# Patient Record
Sex: Female | Born: 1955
Health system: Southern US, Community
[De-identification: ages and names within clinical notes are randomized; demographics above are authoritative.]

## PROBLEM LIST (undated history)

## (undated) DIAGNOSIS — F32A Depression, unspecified: Secondary | ICD-10-CM

## (undated) DIAGNOSIS — F419 Anxiety disorder, unspecified: Secondary | ICD-10-CM

## (undated) DIAGNOSIS — T4145XA Adverse effect of unspecified anesthetic, initial encounter: Secondary | ICD-10-CM

## (undated) DIAGNOSIS — F329 Major depressive disorder, single episode, unspecified: Secondary | ICD-10-CM

## (undated) DIAGNOSIS — K589 Irritable bowel syndrome without diarrhea: Secondary | ICD-10-CM

## (undated) DIAGNOSIS — K2970 Gastritis, unspecified, without bleeding: Secondary | ICD-10-CM

## (undated) DIAGNOSIS — A048 Other specified bacterial intestinal infections: Secondary | ICD-10-CM

## (undated) DIAGNOSIS — M810 Age-related osteoporosis without current pathological fracture: Secondary | ICD-10-CM

## (undated) DIAGNOSIS — T8859XA Other complications of anesthesia, initial encounter: Secondary | ICD-10-CM

## (undated) HISTORY — PX: THROAT SURGERY: SHX803

## (undated) HISTORY — DX: Other specified bacterial intestinal infections: A04.8

## (undated) HISTORY — DX: Irritable bowel syndrome, unspecified: K58.9

## (undated) HISTORY — DX: Gastritis, unspecified, without bleeding: K29.70

## (undated) HISTORY — DX: Anxiety disorder, unspecified: F41.9

## (undated) HISTORY — DX: Depression, unspecified: F32.A

## (undated) HISTORY — PX: BLADDER SURGERY: SHX569

## (undated) HISTORY — PX: ABDOMINAL HYSTERECTOMY: SHX81

## (undated) HISTORY — DX: Major depressive disorder, single episode, unspecified: F32.9

## (undated) HISTORY — DX: Age-related osteoporosis without current pathological fracture: M81.0

## (undated) HISTORY — PX: UPPER GASTROINTESTINAL ENDOSCOPY: SHX188

## (undated) HISTORY — PX: CHOLECYSTECTOMY: SHX55

---

## 2015-08-23 ENCOUNTER — Ambulatory Visit (INDEPENDENT_AMBULATORY_CARE_PROVIDER_SITE_OTHER): Payer: Self-pay | Admitting: Physician Assistant

## 2015-08-23 VITALS — BP 108/70 | HR 64 | Temp 98.4°F | Resp 18 | Ht 62.5 in | Wt 152.4 lb

## 2015-08-23 DIAGNOSIS — K625 Hemorrhage of anus and rectum: Secondary | ICD-10-CM

## 2015-08-23 DIAGNOSIS — R634 Abnormal weight loss: Secondary | ICD-10-CM

## 2015-08-23 DIAGNOSIS — R630 Anorexia: Secondary | ICD-10-CM

## 2015-08-23 DIAGNOSIS — R1013 Epigastric pain: Secondary | ICD-10-CM

## 2015-08-23 DIAGNOSIS — G8929 Other chronic pain: Secondary | ICD-10-CM

## 2015-08-23 LAB — POCT CBC
Granulocyte percent: 72.6 %G (ref 37–80)
HEMATOCRIT: 46.8 % (ref 37.7–47.9)
Hemoglobin: 14.8 g/dL (ref 12.2–16.2)
LYMPH, POC: 2.1 (ref 0.6–3.4)
MCH, POC: 27.1 pg (ref 27–31.2)
MCHC: 31.7 g/dL — AB (ref 31.8–35.4)
MCV: 85.4 fL (ref 80–97)
MID (CBC): 0.3 (ref 0–0.9)
MPV: 8.7 fL (ref 0–99.8)
PLATELET COUNT, POC: 338 10*3/uL (ref 142–424)
POC Granulocyte: 6.5 (ref 2–6.9)
POC LYMPH %: 24 % (ref 10–50)
POC MID %: 3.4 %M (ref 0–12)
RBC: 5.48 M/uL (ref 4.04–5.48)
RDW, POC: 15.9 %
WBC: 8.9 10*3/uL (ref 4.6–10.2)

## 2015-08-23 LAB — COMPREHENSIVE METABOLIC PANEL
ALBUMIN: 4.5 g/dL (ref 3.6–5.1)
ALK PHOS: 84 U/L (ref 33–130)
ALT: 12 U/L (ref 6–29)
AST: 17 U/L (ref 10–35)
BILIRUBIN TOTAL: 1.3 mg/dL — AB (ref 0.2–1.2)
BUN: 11 mg/dL (ref 7–25)
CO2: 26 mmol/L (ref 20–31)
CREATININE: 0.75 mg/dL (ref 0.50–1.05)
Calcium: 9.8 mg/dL (ref 8.6–10.4)
Chloride: 104 mmol/L (ref 98–110)
Glucose, Bld: 84 mg/dL (ref 65–99)
Potassium: 4.1 mmol/L (ref 3.5–5.3)
SODIUM: 140 mmol/L (ref 135–146)
TOTAL PROTEIN: 7.8 g/dL (ref 6.1–8.1)

## 2015-08-23 LAB — POC HEMOCCULT BLD/STL (OFFICE/1-CARD/DIAGNOSTIC): Fecal Occult Blood, POC: NEGATIVE

## 2015-08-23 LAB — LIPASE: LIPASE: 15 U/L (ref 7–60)

## 2015-08-23 MED ORDER — SUCRALFATE 1 GM/10ML PO SUSP: 1.0000 g | Freq: Three times a day (TID) | ORAL | Status: DC

## 2015-08-23 MED ORDER — OMEPRAZOLE 40 MG PO CPDR: 40.0000 mg | DELAYED_RELEASE_CAPSULE | Freq: Every day | ORAL | Status: DC

## 2015-08-23 NOTE — Patient Instructions (Signed)
Take omeprazole 40 mg once a day in the morning before eating. Drink carafate 4 times a day. You will get a phone call to make appointment with stomach doctor. Monitor your rectal bleeding and do not strain on the toilet. Increase your fiber.

## 2015-08-23 NOTE — Progress Notes (Signed)
[facility] [address] [place] [address] [phone]  Date:  08/23/2015   Name:  Casey Brewer   DOB:  1956/11/08   MRN:  [id number]  PCP:  No PCP Per Patient    Chief Complaint: Abdominal Pain; Anorexia; and Bloated   History of Present Illness:  This is a 59 y.o. female with PMH depression who is presenting with 1 year history of epigastric abdominal pain. She is presenting with family members, one of which speaks english and is translating. She arrived here from [place] 1 month ago and is planning to return in 5 months.   She gets a burning sensation with eating. When she is not eating she feels fine. States she is belching a lot. She is not eating much and feels very bloated. She has lost 8 pounds in the past 1 month d/t decreased appetite. Had endoscopy in [place] 1.5 months ago and was told she had gastritis. States a year ago when this started she had an endoscopy and had a polyp removed but never heard anything else about it. She was given medicine which she has been taking for 1.5 months but states this is not helping and stopped 4 days ago. She was taking pantoprazole 40 mg QAM, rameprazole QOD and two other medications for her stomach that are not prescribed in the [place]. She is not sure if she was ever tested for H pylori and does not recall if she has taken abx for her pain. Does not take any regular NSAIDs. She has modified her diet since all this started to avoid exacerbating her sx - has stopped eating juice, greasy food, spicy food, soda. She denies n/v/d, fever, chills, vaginal discharge, dysuria, urinay frequency, cough, CP, sob.  Small amount bright red blood on the toilet paper since yesterday. No rectal pain and no hx hemorrhoids.  She takes gabapentin for right arm pain.  Review of Systems:  Review of Systems See HPI  There are no active problems to display for this patient.   Prior to Admission medications   Medication Sig Start Date End  Date Taking? Authorizing Provider  GABAPENTIN PO Take by mouth.   Yes Historical Provider, MD    No Known Allergies  Past Surgical History  Procedure Laterality Date  . Cholecystectomy    . Abdominal hysterectomy    . Throat surgery    . Bladder surgery      Social History  Substance Use Topics  . Smoking status: Never Smoker   . Smokeless tobacco: None  . Alcohol Use: None    History reviewed. No pertinent family history.  Medication list has been reviewed and updated.  Physical Examination:  Physical Exam  Constitutional: She is oriented to person, place, and time. She appears well-developed and well-nourished. No distress.  HENT:  Head: Normocephalic and atraumatic.  Right Ear: Hearing normal.  Left Ear: Hearing normal.  Nose: Nose normal.  Mouth/Throat: Uvula is midline, oropharynx is clear and moist and mucous membranes are normal.  Eyes: Conjunctivae and lids are normal. Right eye exhibits no discharge. Left eye exhibits no discharge. No scleral icterus.  Neck: Carotid bruit is not present.  Cardiovascular: Normal rate, regular rhythm, normal heart sounds and normal pulses.   No murmur heard. Pulmonary/Chest: Effort normal and breath sounds normal. No respiratory distress. She has no wheezes. She has no rhonchi. She has no rales.  Abdominal: Soft. Normal appearance. There is tenderness.  General abdominal tenderness, worse over epigastrum  Genitourinary: Rectal exam shows internal hemorrhoid (6 o'clock). Rectal exam shows no tenderness.  Musculoskeletal: Normal range of motion.  Lymphadenopathy:       Head (right side): No submental, no submandibular and no tonsillar adenopathy present.       Head (left side): No submental, no submandibular and no tonsillar adenopathy present.    She has no cervical adenopathy.  Neurological: She is alert and oriented to person, place, and time.  Skin: Skin is warm, dry and intact. No lesion and no rash noted.  Psychiatric: She  has a normal mood and affect. Her speech is normal and behavior is normal. Thought content normal.   BP 108/70 mmHg  Pulse 64  Temp(Src) 98.4 F (36.9 C) (Oral)  Resp 18  Ht 5' 2.5" (1.588 m)  Wt 152 lb 6.4 oz (69.128 kg)  BMI 27.41 kg/m2  SpO2 98%  Results for orders placed or performed in visit on 08/23/15  POCT CBC  Result Value Ref Range   WBC 8.9 4.6 - 10.2 K/uL   Lymph, poc 2.1 0.6 - 3.4   POC LYMPH PERCENT 24.0 10 - 50 %L   MID (cbc) 0.3 0 - 0.9   POC MID % 3.4 0 - 12 %M   POC Granulocyte 6.5 2 - 6.9   Granulocyte percent 72.6 37 - 80 %G   RBC 5.48 4.04 - 5.48 M/uL   Hemoglobin 14.8 12.2 - 16.2 g/dL   HCT, POC 16.1 09.6 - 47.9 %   MCV 85.4 80 - 97 fL   MCH, POC 27.1 27 - 31.2 pg   MCHC 31.7 (A) 31.8 - 35.4 g/dL   RDW, POC 04.5 %   Platelet Count, POC 338 142 - 424 K/uL   MPV 8.7 0 - 99.8 fL  POC Hemoccult Bld/Stl (1-Cd Office Dx)  Result Value Ref Range   Card #1 Date     Fecal Occult Blood, POC Negative Negative    Assessment and Plan:  1. Abdominal pain, chronic, epigastric 2. Decreased appetite 3. Loss of weight H pylori, lipase, CMP pending. She will stop all meds prescribed in [place]. She will start prilosec QAM and carafate QID. She has been referred to GI for further evaluation. - H. pylori breath test - Lipase - Comprehensive metabolic panel - omeprazole (PRILOSEC) 40 MG capsule; Take 1 capsule (40 mg total) by mouth daily.  Dispense: 30 capsule; Refill: 3 - sucralfate (CARAFATE) 1 GM/10ML suspension; Take 10 mLs (1 g total) by mouth 4 (four) times daily -  with meals and at bedtime.  Dispense: 420 mL; Refill: 0 - Ambulatory referral to Gastroenterology  4. Rectal bleeding CBC wnl, she is not anemic. Internal hemorrhoid felt on exam, likely cause of rectal bleeding. Hemoccult negative. She has never had a colonoscopy. She has been referred to GI for eval epigastric pain -- will discuss getting colonoscopy as well. - POCT CBC - POC Hemoccult Bld/Stl  (1-Cd Office Dx)   Roswell Miners. Dyke Brackett, MHS [facility]  08/28/2015

## 2015-08-24 ENCOUNTER — Encounter: Payer: Self-pay | Admitting: Internal Medicine

## 2015-08-24 LAB — H. PYLORI BREATH TEST: H. pylori Breath Test: DETECTED — AB

## 2015-08-30 ENCOUNTER — Telehealth: Payer: Self-pay | Admitting: Physician Assistant

## 2015-08-30 DIAGNOSIS — A048 Other specified bacterial intestinal infections: Secondary | ICD-10-CM

## 2015-08-30 MED ORDER — AMOXICILLIN 500 MG PO TABS: ORAL_TABLET | ORAL | Status: AC

## 2015-08-30 MED ORDER — CLARITHROMYCIN 500 MG PO TABS: 500.0000 mg | ORAL_TABLET | Freq: Two times a day (BID) | ORAL | Status: AC

## 2015-08-30 NOTE — Telephone Encounter (Signed)
Called and LMOM to CB.  Pt has H pylori - other lab tests were negative. I am going to prescribed two antibiotics that she needs to take twice a day for 14 days.  IN ADDITION -- she needs to increase the omeprazole that I prescribed at last visit to twice a day for 14 days. At the end of the 14 days, she can go back to once a day. It is up to her whether she was wants to keep appt with GI, or cancel for now, and see if these antibiotics help to resolve her symptoms.

## 2015-08-30 NOTE — Telephone Encounter (Signed)
Granddaughter received the message from the [place]. So I just informed her of message, and to call back if she had concerns

## 2015-09-04 ENCOUNTER — Telehealth: Payer: Self-pay

## 2015-09-04 NOTE — Telephone Encounter (Signed)
I spoke to grand daughter today. Patient is being treated for Hpylori and c/o nausea with medications. I have encouraged patient to continue medications, make sure she takes the medications with food, and drinks a lot of water. I have also encouraged her to use 2 daily of the omeprazole, and to let us know if this is not helpful. To you FYI

## 2015-09-05 ENCOUNTER — Encounter: Payer: Self-pay | Admitting: Internal Medicine

## 2016-09-23 ENCOUNTER — Emergency Department (HOSPITAL_COMMUNITY): Payer: Self-pay

## 2016-09-23 ENCOUNTER — Emergency Department (HOSPITAL_COMMUNITY)
Admission: EM | Admit: 2016-09-23 | Discharge: 2016-09-24 | Disposition: A | Discharge status: Home / Self Care | Payer: Self-pay | Attending: Emergency Medicine | Admitting: Emergency Medicine

## 2016-09-23 ENCOUNTER — Encounter (HOSPITAL_COMMUNITY): Payer: Self-pay

## 2016-09-23 DIAGNOSIS — J40 Bronchitis, not specified as acute or chronic: Secondary | ICD-10-CM | POA: Insufficient documentation

## 2016-09-23 MED ORDER — ALBUTEROL SULFATE HFA 108 (90 BASE) MCG/ACT IN AERS
2.0000 | INHALATION_SPRAY | Freq: Once | RESPIRATORY_TRACT | Status: AC
  Administered 2016-09-24: 2 via RESPIRATORY_TRACT
  Filled 2016-09-23: qty 6.7

## 2016-09-23 NOTE — ED Notes (Signed)
Pt presents with 2 weeks of SOB and cough, SOB on exertion.  Pt fatigues easily. Per niece, her children are concerned with her "head and lips trembling."

## 2016-09-23 NOTE — ED Triage Notes (Signed)
Pt complaining of cough x 2 weeks. Just finished Zpack, no obvious improvement. Pt complaining of some SOB with coughing. Pt denies any chest pain.

## 2016-09-23 NOTE — ED Provider Notes (Signed)
[facility]-EMERGENCY DEPT Provider Note   CSN: [id number] Arrival date & time: 09/23/16  2049  By signing my name below, I, [name]. Elon Spanner, attest that this documentation has been prepared under the direction and in the presence of Shon Baton, MD.  Electronically Signed: [name]. Elon Spanner, ED Scribe. 09/23/16. 11:57 PM.    History   Chief Complaint Chief Complaint  Patient presents with  . Cough   HPI  HPI Comments: Casey Brewer is a 60 y.o. female without any pertinent past medical history who presents to the Emergency Department complaining of constant, unchanged productive cough x 2 weeks. Pt was initally evaluated at a near by Urgent Care for cough. X-Ray performed which revealed an "inflammed lung". She was started on antibiotics which she completed today. However, she states the medication did not help her much. Pt also reports intermittent shortness of breath and chest pain x 1-2 days. However, currently she states she is only feeling shortness of breath at the moment. No other OTC/prescribed medications attempted prior to arrival. No recent fever or chills. No history of blood clots or leg swelling.  PCP: No PCP Per Patient    Past Medical History:  Diagnosis Date  . Depression     There are no active problems to display for this patient.   Past Surgical History:  Procedure Laterality Date  . ABDOMINAL HYSTERECTOMY    . BLADDER SURGERY    . CHOLECYSTECTOMY    . THROAT SURGERY      OB History    No data available       Home Medications    Prior to Admission medications   Medication Sig Start Date End Date Taking? Authorizing Provider  albuterol (PROVENTIL HFA;VENTOLIN HFA) 108 (90 Base) MCG/ACT inhaler Inhale 2 puffs into the lungs every 4 (four) hours as needed (cough). 09/24/16   Shon Baton, MD  methylPREDNISolone (MEDROL DOSEPAK) 4 MG TBPK tablet Take as directed on packet. 09/24/16   Shon Baton, MD  omeprazole (PRILOSEC) 40 MG capsule Take  1 capsule (40 mg total) by mouth daily. Patient not taking: Reported on 09/23/2016 08/23/15   Dorna Leitz, PA-C  sucralfate (CARAFATE) 1 GM/10ML suspension Take 10 mLs (1 g total) by mouth 4 (four) times daily -  with meals and at bedtime. Patient not taking: Reported on 09/23/2016 08/23/15   Dorna Leitz, PA-C    Family History History reviewed. No pertinent family history.  Social History Social History  Substance Use Topics  . Smoking status: Never Smoker  . Smokeless tobacco: Never Used  . Alcohol use No     Allergies   Review of patient's allergies indicates no known allergies.   Review of Systems Review of Systems  Constitutional: Negative for chills and fever.  Respiratory: Positive for cough and shortness of breath.   Cardiovascular: Positive for chest pain.  Gastrointestinal: Negative for nausea.  All other systems reviewed and are negative.    Physical Exam Updated Vital Signs BP 117/79   Pulse 64   Temp 98.4 F (36.9 C) (Oral)   Resp 15   SpO2 96%   Physical Exam  Constitutional: She is oriented to person, place, and time. She appears well-developed and well-nourished. No distress.  HENT:  Head: Normocephalic and atraumatic.  Eyes: Pupils are equal, round, and reactive to light.  Cardiovascular: Normal rate, regular rhythm and normal heart sounds.   Pulmonary/Chest: Effort normal and breath sounds normal. No respiratory distress. She has no wheezes.  Abdominal: Soft. Bowel sounds are normal. There is no tenderness. There is no guarding.  Musculoskeletal: She exhibits no edema.  Neurological: She is alert and oriented to person, place, and time.  Skin: Skin is warm and dry.  Psychiatric: She has a normal mood and affect.  Nursing note and vitals reviewed.    ED Treatments / Results   DIAGNOSTIC STUDIES: Oxygen Saturation is 99% on RA, Normal by my interpretation.    COORDINATION OF CARE: 11:42 PM- Will give breathing treatment. Will order  blood work, EKG, and CXR. Discussed treatment plan with pt at bedside and pt agreed to plan.     Labs (all labs ordered are listed, but only abnormal results are displayed) Labs Reviewed  BASIC METABOLIC PANEL - Abnormal; Notable for the following:       Result Value   CO2 21 (*)    All other components within normal limits  CBC WITH DIFFERENTIAL/PLATELET  Rosezena Sensor, ED    EKG  EKG Interpretation  Date/Time:  Monday September 24 2016 00:20:30 EDT Ventricular Rate:  56 PR Interval:    QRS Duration: 79 QT Interval:  468 QTC Calculation: 452 R Axis:   34 Text Interpretation:  Sinus rhythm Low voltage, precordial leads Abnormal R-wave progression, early transition Confirmed by Wilkie Aye  MD, Billye Pickerel ([id number]) on 09/24/2016 12:38:23 AM       Radiology Dg Chest 2 View  Result Date: 09/23/2016 CLINICAL DATA:  Productive cough for 1 week.  Shortness of breath. EXAM: CHEST  2 VIEW COMPARISON:  None. FINDINGS: Cardiomediastinal silhouette is normal. No pleural effusions or focal consolidations. Trachea projects midline and there is no pneumothorax. Soft tissue planes and included osseous structures are non-suspicious. Surgical clips in the included right abdomen compatible with cholecystectomy. IMPRESSION: Normal chest. Electronically Signed   By: Awilda Metro M.D.   On: 09/23/2016 23:29    Procedures Procedures (including critical care time)  Medications Ordered in ED Medications  albuterol (PROVENTIL HFA;VENTOLIN HFA) 108 (90 Base) MCG/ACT inhaler 2 puff (2 puffs Inhalation Given 09/24/16 0021)     Initial Impression / Assessment and Plan / ED Course  I have reviewed the triage vital signs and the nursing notes.  Pertinent labs & imaging results that were available during my care of the patient were reviewed by me and considered in my medical decision making (see chart for details).  Clinical Course   Patient presents with shortness of breath. Recent diagnosis of  sounds like bronchitis. Was on anabiotic's. Not clinically getting better. She is nontoxic. Vital signs are reassuring. No respiratory distress. Chest x-ray is clear. EKG and lab work is largely reassuring. Doubt PE as patient is low risk and has had URI symptoms. Will not further investigate. Patient reports improvement with inhaler. Suspect bronchitis.  1:36 AM Patient reports improvement with albuterol. Nontoxic. Vital signs remained stable. Will discharge him with albuterol and a Medrol Dosepak.  After history, exam, and medical workup I feel the patient has been appropriately medically screened and is safe for discharge home. Pertinent diagnoses were discussed with the patient. Patient was given return precautions.   Final Clinical Impressions(s) / ED Diagnoses   Final diagnoses:  Bronchitis    New Prescriptions New Prescriptions   ALBUTEROL (PROVENTIL HFA;VENTOLIN HFA) 108 (90 BASE) MCG/ACT INHALER    Inhale 2 puffs into the lungs every 4 (four) hours as needed (cough).   METHYLPREDNISOLONE (MEDROL DOSEPAK) 4 MG TBPK TABLET    Take as directed on packet.  I personally performed the services described in this documentation, which was scribed in my presence. The recorded information has been reviewed and is accurate.    Shon Baton, MD 09/24/16 [phone]

## 2016-09-24 ENCOUNTER — Ambulatory Visit: Payer: Self-pay

## 2016-09-24 LAB — CBC WITH DIFFERENTIAL/PLATELET
BASOS ABS: 0.1 10*3/uL (ref 0.0–0.1)
BASOS PCT: 1 %
EOS ABS: 0.2 10*3/uL (ref 0.0–0.7)
Eosinophils Relative: 2 %
HEMATOCRIT: 42 % (ref 36.0–46.0)
Hemoglobin: 14.4 g/dL (ref 12.0–15.0)
Lymphocytes Relative: 35 %
Lymphs Abs: 3.4 10*3/uL (ref 0.7–4.0)
MCH: 29.2 pg (ref 26.0–34.0)
MCHC: 34.3 g/dL (ref 30.0–36.0)
MCV: 85.2 fL (ref 78.0–100.0)
MONO ABS: 0.5 10*3/uL (ref 0.1–1.0)
MONOS PCT: 5 %
NEUTROS ABS: 5.4 10*3/uL (ref 1.7–7.7)
Neutrophils Relative %: 57 %
PLATELETS: 340 10*3/uL (ref 150–400)
RBC: 4.93 MIL/uL (ref 3.87–5.11)
RDW: 13 % (ref 11.5–15.5)
WBC: 9.5 10*3/uL (ref 4.0–10.5)

## 2016-09-24 LAB — BASIC METABOLIC PANEL WITH GFR
Anion gap: 10 (ref 5–15)
BUN: 14 mg/dL (ref 6–20)
CO2: 21 mmol/L — ABNORMAL LOW (ref 22–32)
Calcium: 9.4 mg/dL (ref 8.9–10.3)
Chloride: 107 mmol/L (ref 101–111)
Creatinine, Ser: 0.79 mg/dL (ref 0.44–1.00)
GFR calc Af Amer: >60 mL/min
GFR calc non Af Amer: >60 mL/min
Glucose, Bld: 95 mg/dL (ref 65–99)
Potassium: 3.7 mmol/L (ref 3.5–5.1)
Sodium: 138 mmol/L (ref 135–145)

## 2016-09-24 LAB — I-STAT TROPONIN, ED: Troponin i, poc: 0 ng/mL (ref 0.00–0.08)

## 2016-09-24 MED ORDER — METHYLPREDNISOLONE 4 MG PO TBPK: ORAL_TABLET | ORAL | 0 refills | Status: DC

## 2016-09-24 MED ORDER — ALBUTEROL SULFATE HFA 108 (90 BASE) MCG/ACT IN AERS: 2.0000 | INHALATION_SPRAY | RESPIRATORY_TRACT | 0 refills | Status: DC | PRN

## 2016-09-24 NOTE — ED Notes (Addendum)
Patient sts she feels better after the inhaler.

## 2016-10-05 LAB — GLUCOSE, POCT (MANUAL RESULT ENTRY): POC GLUCOSE: 88 mg/dL (ref 70–99)

## 2018-04-22 ENCOUNTER — Other Ambulatory Visit: Payer: Self-pay

## 2018-04-22 ENCOUNTER — Encounter: Payer: Self-pay | Admitting: Family Medicine

## 2018-04-22 ENCOUNTER — Ambulatory Visit (INDEPENDENT_AMBULATORY_CARE_PROVIDER_SITE_OTHER): Payer: BLUE CROSS/BLUE SHIELD | Admitting: Family Medicine

## 2018-04-22 VITALS — BP 112/80 | HR 76 | Temp 98.6°F | Ht 59.84 in | Wt 152.4 lb

## 2018-04-22 DIAGNOSIS — R748 Abnormal levels of other serum enzymes: Secondary | ICD-10-CM | POA: Diagnosis not present

## 2018-04-22 DIAGNOSIS — K219 Gastro-esophageal reflux disease without esophagitis: Secondary | ICD-10-CM | POA: Diagnosis not present

## 2018-04-22 DIAGNOSIS — Z13228 Encounter for screening for other metabolic disorders: Secondary | ICD-10-CM | POA: Diagnosis not present

## 2018-04-22 NOTE — Progress Notes (Signed)
5/21/20193:26 PM  Casey Brewer 05/05/1956, 62 y.o. female [id number]  Chief Complaint  Patient presents with  . Establish Care    Was told in the past that she has a fatty liver and may have sclerosis of the liver. will like labs drawn to confirm. Just moved here [place], has had mammogram and colonoscopy. She will complete consent for medical records    HPI:   Patient is a 62 y.o. female with past medical history significant for recent long hospitalization in [place] for abnormal LFTs who presents today to establish care  Patient does not bring discharge summary She does bring some labs and diagnostic studies done during the is time She reports in December 2018 feeling ill and found to have abnormal LFTs She is s/p chole and she explains that the thought was that there might have been a stricture From what I can gather, it seems that they attempted an ERCP and were able to either partially open the stricture or place a stent.  She reports prolonged stay was from complication during endoscopic procedure where she ended with soft tissue emphysema including neck and face  Nov 30 2017 Labs WBC 7 Hgb 15.4 Hct 45 Plts 263  glc 113 Na 139 K 4.0 Cl 102 Ca 11.2 (corrected Ca 12.2) PO4 4.2 Mg 1.84  BUN 9.8 crt 0.60 TP 6.6 Alb 3.1 Globulins 3.5 ALP 237 LDH 495 AST 270 ALT 188 GGT 327 TB 1.2 Direct bili 0.6 Indirect bili 0.6 Amylase 33 Lipase 6.9  TC 190 HDL 41 LDL 129 TG 102  In Jan 21 2018, CMP had normalized except for minimally elevated PO4 of 4.8  Biopsy from EGD done on Dec 07 2017:  #1. chronic gastritis no erosive, neg h pylori #2. fragment from gastric mucosal lesion - neg h pylori, no malignancy, probable peptic ulcer  CT abd with contrast on Jan 03 2017 (question date?) Emphysema of soft tissues of thorax and abdomen Bone normal Lung bases and pleura normal Normal liver, homogenous, no dilation of intrahepatic biliary ducts Surgical absence of  gallbladder Pancreatic head and uncinate process enlarged, measuring 4.1cm. There are discrete surrounding inflammatory changes. Body and tail of pancreas are normal. Homogenous density. Normal kidneys Normal bladder Surgical absence of uterus No adnexal masses Spleen normal Stomach normal Small intestine normal Large intestine normal Aorta and iliac arteries are normal There is minimal residual free air in the mesogastic region, right paracolic gutter and left iliac fossa  She reports doing well, unclear what really happened, no way to get discharge summary and operative reports from hospital in [place] She would like to have LFTs rechecked  She reports current medications were started while she was in the hospital  She reports a normal colonoscopy 2 years ago A normal mammogram a year ago Reports last Td < 10 years ago  Fall Risk  04/22/2018  Falls in the past year? No     Depression screen PHQ 2/9 04/22/2018  Decreased Interest 0  Down, Depressed, Hopeless 0  PHQ - 2 Score 0    No Known Allergies  Prior to Admission medications   Medication Sig Start Date End Date Taking? Authorizing Provider  pantoprazole (PROTONIX) 40 MG tablet Take 40 mg by mouth daily.   Yes [provider]  Amitriptyline/diazepam/perfenazine 78m/3mg/2mg  She reports taking 1/2 tab at bedtime     Past Medical History:  Diagnosis Date  . Depression     Past Surgical History:  Procedure Laterality Date  .  ABDOMINAL HYSTERECTOMY    . BLADDER SURGERY    . CHOLECYSTECTOMY    . THROAT SURGERY      Social History   Tobacco Use  . Smoking status: Never Smoker  . Smokeless tobacco: Never Used  Substance Use Topics  . Alcohol use: No    Alcohol/week: 0.0 oz    Family History  Problem Relation Age of Onset  . Heart disease Mother     Review of Systems  Constitutional: Negative for chills and fever.  Respiratory: Negative for cough and shortness of breath.   Cardiovascular:  Negative for chest pain, palpitations and leg swelling.  Gastrointestinal: Negative for abdominal pain, blood in stool, constipation, diarrhea, heartburn, melena, nausea and vomiting.  All other systems reviewed and are negative.    OBJECTIVE:  Blood pressure 112/80, pulse 76, temperature 98.6 F (37 C), temperature source Oral, height 4' 11.84" (1.52 m), weight 152 lb 6.4 oz (69.1 kg), peak flow 98 L/min.  Physical Exam  Constitutional: She is oriented to person, place, and time. She appears well-developed and well-nourished.  HENT:  Head: Normocephalic and atraumatic.  Right Ear: Hearing, tympanic membrane, external ear and ear canal normal.  Left Ear: Hearing, tympanic membrane, external ear and ear canal normal.  Mouth/Throat: Oropharynx is clear and moist.  Eyes: Pupils are equal, round, and reactive to light. Conjunctivae and EOM are normal.  Neck: Neck supple. No thyromegaly present.  Cardiovascular: Normal rate, regular rhythm, normal heart sounds and intact distal pulses. Exam reveals no gallop and no friction rub.  No murmur heard. Pulmonary/Chest: Effort normal and breath sounds normal. She has no wheezes. She has no rales.  Abdominal: Soft. Bowel sounds are normal. She exhibits no distension and no mass. There is no tenderness.  Musculoskeletal: Normal range of motion. She exhibits no edema.  Lymphadenopathy:    She has no cervical adenopathy.  Neurological: She is alert and oriented to person, place, and time. She has normal reflexes. No cranial nerve deficit. Gait normal.  Skin: Skin is warm and dry.  Psychiatric: She has a normal mood and affect.  Nursing note and vitals reviewed.   ASSESSMENT and PLAN  1. Abnormal liver enzymes Patient completely asymptomatic and normal exam. Etiology of abnormal LFTs in Dec 2018 is unclear however they had completely normalized by Feb. CT abd suggestive of pancreatitis and EGD suggestive of peptic ulcer. Cont with PPI. Repeat  labs. Defer to GI for any further evaluation and mgt.  - CMP14+EGFR - CBC with Differential/Platelet - Lipase - Ambulatory referral to Gastroenterology  2. Gastroesophageal reflux disease, esophagitis presence not specified Controlled. Cont current regime  3. Screening for metabolic disorder - Lipid panel - TSH - Hemoglobin A1c  Discussed with patient stopping combo pill, slowly weaning off.   Other orders - pantoprazole (PROTONIX) 40 MG tablet; Take 40 mg by mouth daily.  Return in about 1 month (around 05/20/2018).    Rutherford Guys, MD [facility], [place] [address] Ph.  [phone] Fax [phone]

## 2018-04-22 NOTE — Patient Instructions (Signed)
     IF you received an x-ray today, you will receive an invoice from [facility]. Please contact [facility] at [phone] with questions or concerns regarding your invoice.   IF you received labwork today, you will receive an invoice from [facility]. Please contact [facility] at [phone] with questions or concerns regarding your invoice.   Our billing staff will not be able to assist you with questions regarding bills from these companies.  You will be contacted with the lab results as soon as they are available. The fastest way to get your results is to activate your My Chart account. Instructions are located on the last page of this paperwork. If you have not heard from us regarding the results in 2 weeks, please contact this office.     

## 2018-04-23 ENCOUNTER — Encounter: Payer: Self-pay | Admitting: Gastroenterology

## 2018-04-23 LAB — CMP14+EGFR
ALT: 18 IU/L (ref 0–32)
AST: 20 IU/L (ref 0–40)
Albumin/Globulin Ratio: 1.6 (ref 1.2–2.2)
Albumin: 4.4 g/dL (ref 3.6–4.8)
Alkaline Phosphatase: 83 IU/L (ref 39–117)
BUN/Creatinine Ratio: 15 (ref 12–28)
BUN: 12 mg/dL (ref 8–27)
Bilirubin Total: 0.6 mg/dL (ref 0.0–1.2)
CO2: 22 mmol/L (ref 20–29)
Calcium: 9.7 mg/dL (ref 8.7–10.3)
Chloride: 105 mmol/L (ref 96–106)
Creatinine, Ser: 0.79 mg/dL (ref 0.57–1.00)
GFR calc Af Amer: 93 mL/min/{1.73_m2} (ref >59)
GFR calc non Af Amer: 80 mL/min/{1.73_m2} (ref >59)
Globulin, Total: 2.8 g/dL (ref 1.5–4.5)
Glucose: 77 mg/dL (ref 65–99)
Potassium: 4.6 mmol/L (ref 3.5–5.2)
Sodium: 141 mmol/L (ref 134–144)
Total Protein: 7.2 g/dL (ref 6.0–8.5)

## 2018-04-23 LAB — CBC WITH DIFFERENTIAL/PLATELET
Basophils Absolute: 0.1 10*3/uL (ref 0.0–0.2)
Basos: 1 %
EOS (ABSOLUTE): 0.3 10*3/uL (ref 0.0–0.4)
Eos: 3 %
Hematocrit: 41.3 % (ref 34.0–46.6)
Hemoglobin: 14 g/dL (ref 11.1–15.9)
Immature Grans (Abs): 0 10*3/uL (ref 0.0–0.1)
Immature Granulocytes: 0 %
Lymphocytes Absolute: 2.9 10*3/uL (ref 0.7–3.1)
Lymphs: 27 %
MCH: 28.7 pg (ref 26.6–33.0)
MCHC: 33.9 g/dL (ref 31.5–35.7)
MCV: 85 fL (ref 79–97)
Monocytes Absolute: 0.6 10*3/uL (ref 0.1–0.9)
Monocytes: 6 %
Neutrophils Absolute: 6.8 10*3/uL (ref 1.4–7.0)
Neutrophils: 63 %
Platelets: 301 10*3/uL (ref 150–450)
RBC: 4.88 x10E6/uL (ref 3.77–5.28)
RDW: 14.4 % (ref 12.3–15.4)
WBC: 10.7 10*3/uL (ref 3.4–10.8)

## 2018-04-23 LAB — LIPID PANEL
Chol/HDL Ratio: 4.7 ratio — ABNORMAL HIGH (ref 0.0–4.4)
Cholesterol, Total: 230 mg/dL — ABNORMAL HIGH (ref 100–199)
HDL: 49 mg/dL (ref >39)
LDL Calculated: 128 mg/dL — ABNORMAL HIGH (ref 0–99)
Triglycerides: 267 mg/dL — ABNORMAL HIGH (ref 0–149)
VLDL Cholesterol Cal: 53 mg/dL — ABNORMAL HIGH (ref 5–40)

## 2018-04-23 LAB — HEMOGLOBIN A1C
Est. average glucose Bld gHb Est-mCnc: 114 mg/dL
Hgb A1c MFr Bld: 5.6 % (ref 4.8–5.6)

## 2018-04-23 LAB — TSH: TSH: 1.86 u[IU]/mL (ref 0.450–4.500)

## 2018-04-23 LAB — LIPASE: Lipase: 25 U/L (ref 14–72)

## 2018-05-21 ENCOUNTER — Other Ambulatory Visit: Payer: Self-pay

## 2018-05-21 ENCOUNTER — Encounter: Payer: Self-pay | Admitting: Family Medicine

## 2018-05-21 ENCOUNTER — Ambulatory Visit: Payer: BLUE CROSS/BLUE SHIELD | Admitting: Family Medicine

## 2018-05-21 VITALS — BP 108/68 | HR 79 | Temp 98.8°F | Resp 16 | Ht 60.95 in | Wt 151.6 lb

## 2018-05-21 DIAGNOSIS — R748 Abnormal levels of other serum enzymes: Secondary | ICD-10-CM | POA: Diagnosis not present

## 2018-05-21 DIAGNOSIS — J0110 Acute frontal sinusitis, unspecified: Secondary | ICD-10-CM

## 2018-05-21 DIAGNOSIS — J302 Other seasonal allergic rhinitis: Secondary | ICD-10-CM | POA: Diagnosis not present

## 2018-05-21 MED ORDER — AMOXICILLIN-POT CLAVULANATE 875-125 MG PO TABS: 1.0000 | ORAL_TABLET | Freq: Two times a day (BID) | ORAL | 0 refills | Status: DC

## 2018-05-21 MED ORDER — FLUTICASONE PROPIONATE 50 MCG/ACT NA SUSP: 1.0000 | Freq: Two times a day (BID) | NASAL | 6 refills | Status: DC

## 2018-05-21 NOTE — Patient Instructions (Addendum)
  1. comprar generico de zyrtec o allegra, tomarse una pastilla diaria   IF you received an x-ray today, you will receive an invoice from [facility]. Please contact [facility] at [phone] with questions or concerns regarding your invoice.   IF you received labwork today, you will receive an invoice from [facility]. Please contact [facility] at [phone] with questions or concerns regarding your invoice.   Our billing staff will not be able to assist you with questions regarding bills from these companies.  You will be contacted with the lab results as soon as they are available. The fastest way to get your results is to activate your My Chart account. Instructions are located on the last page of this paperwork. If you have not heard from us regarding the results in 2 weeks, please contact this office.

## 2018-05-21 NOTE — Progress Notes (Signed)
6/19/201911:59 AM  Casey Brewer 12-08-1955, 62 y.o. female [id number]  Chief Complaint  Patient presents with  . Cough    with nasal drainage and sore throat x 6 days   . Headache    HPI:   Patient is a 62 y.o. female with past medical history significant for abnormal LFTs  who presents today for 6 days of sneezing, itchy ears and throat, nasal congestion with drainage, and frontal headache pressure Has occasional cough, chills and malaise Has not tried anything for this Denies fever, SOB, nausea, vomiting  Fall Risk  05/21/2018 04/22/2018  Falls in the past year? No No     [facility] 05/21/2018 04/22/2018  Decreased Interest 0 0  Down, Depressed, Hopeless 0 0  PHQ - 2 Score 0 0    No Known Allergies  Prior to Admission medications   Medication Sig Start Date End Date Taking? Authorizing Provider  pantoprazole (PROTONIX) 40 MG tablet Take 40 mg by mouth daily.   Yes [provider]    Past Medical History:  Diagnosis Date  . Depression     Past Surgical History:  Procedure Laterality Date  . ABDOMINAL HYSTERECTOMY    . BLADDER SURGERY    . CHOLECYSTECTOMY    . THROAT SURGERY      Social History   Tobacco Use  . Smoking status: Never Smoker  . Smokeless tobacco: Never Used  Substance Use Topics  . Alcohol use: No    Alcohol/week: 0.0 oz    Family History  Problem Relation Age of Onset  . Heart disease Mother     ROS Per hpi  OBJECTIVE:  Blood pressure 108/68, pulse 79, temperature 98.8 F (37.1 C), temperature source Oral, resp. rate 16, height 5' 0.95" (1.548 m), weight 151 lb 9.6 oz (68.8 kg), SpO2 95 %.  Physical Exam  Constitutional: She is oriented to person, place, and time. She appears well-developed and well-nourished.  HENT:  Head: Normocephalic and atraumatic.  Right Ear: Hearing, tympanic membrane, external ear and ear canal normal.  Left Ear: Hearing, tympanic membrane, external ear and ear canal  normal.  Nose: Mucosal edema and rhinorrhea present. Right sinus exhibits frontal sinus tenderness.  Mouth/Throat: Oropharynx is clear and moist and mucous membranes are normal.  Eyes: Pupils are equal, round, and reactive to light. Conjunctivae and EOM are normal.  Neck: Neck supple.  Cardiovascular: Normal rate, regular rhythm and normal heart sounds. Exam reveals no gallop and no friction rub.  No murmur heard. Pulmonary/Chest: Effort normal and breath sounds normal. She has no wheezes. She has no rales.  Lymphadenopathy:    She has no cervical adenopathy.  Neurological: She is alert and oriented to person, place, and time.  Skin: Skin is warm and dry.  Psychiatric: She has a normal mood and affect.  Nursing note and vitals reviewed.    ASSESSMENT and PLAN  1. Seasonal allergies 2. Acute non-recurrent frontal sinusitis Discussed supportive measures, new meds r/se/b and RTC precautions. Patient educational handout given.  3. Abnormal liver enzymes Normal LFTs a month ago. Has appt with GI in July, advised she keep this appt given complicated unclear hospitalization. Discussed that most likely nothing else would be needed.   Other orders - fluticasone (FLONASE) 50 MCG/ACT nasal spray; Place 1 spray into both nostrils 2 (two) times daily. - amoxicillin-clavulanate (AUGMENTIN) 875-125 MG tablet; Take 1 tablet by mouth 2 (two) times daily.  Return if symptoms worsen or fail to improve.  Doriann Zuch M Santiago, MD [facility] [address] [place], [place] [address] Ph.  [phone] Fax [phone]   

## 2018-06-10 ENCOUNTER — Other Ambulatory Visit (INDEPENDENT_AMBULATORY_CARE_PROVIDER_SITE_OTHER): Payer: BLUE CROSS/BLUE SHIELD

## 2018-06-10 ENCOUNTER — Ambulatory Visit: Payer: BLUE CROSS/BLUE SHIELD | Admitting: Gastroenterology

## 2018-06-10 ENCOUNTER — Encounter: Payer: Self-pay | Admitting: Gastroenterology

## 2018-06-10 VITALS — BP 102/64 | HR 68 | Ht 63.0 in | Wt 154.0 lb

## 2018-06-10 DIAGNOSIS — K8020 Calculus of gallbladder without cholecystitis without obstruction: Secondary | ICD-10-CM

## 2018-06-10 LAB — CBC WITH DIFFERENTIAL/PLATELET
Basophils Absolute: 0.1 10*3/uL (ref 0.0–0.1)
Basophils Relative: 1.1 % (ref 0.0–3.0)
EOS PCT: 4.6 % (ref 0.0–5.0)
Eosinophils Absolute: 0.3 10*3/uL (ref 0.0–0.7)
HCT: 40.4 % (ref 36.0–46.0)
Hemoglobin: 13.7 g/dL (ref 12.0–15.0)
Lymphocytes Relative: 35.2 % (ref 12.0–46.0)
Lymphs Abs: 2.4 10*3/uL (ref 0.7–4.0)
MCHC: 33.8 g/dL (ref 30.0–36.0)
MCV: 85.8 fl (ref 78.0–100.0)
MONOS PCT: 7 % (ref 3.0–12.0)
Monocytes Absolute: 0.5 10*3/uL (ref 0.1–1.0)
NEUTROS PCT: 52.1 % (ref 43.0–77.0)
Neutro Abs: 3.5 10*3/uL (ref 1.4–7.7)
Platelets: 281 10*3/uL (ref 150.0–400.0)
RBC: 4.71 Mil/uL (ref 3.87–5.11)
RDW: 13.8 % (ref 11.5–15.5)
WBC: 6.7 10*3/uL (ref 4.0–10.5)

## 2018-06-10 LAB — COMPREHENSIVE METABOLIC PANEL
ALT: 17 U/L (ref 0–35)
AST: 25 U/L (ref 0–37)
Albumin: 3.8 g/dL (ref 3.5–5.2)
Alkaline Phosphatase: 67 U/L (ref 39–117)
BILIRUBIN TOTAL: 0.8 mg/dL (ref 0.2–1.2)
BUN: 17 mg/dL (ref 6–23)
CO2: 29 meq/L (ref 19–32)
CREATININE: 0.74 mg/dL (ref 0.40–1.20)
Calcium: 9.3 mg/dL (ref 8.4–10.5)
Chloride: 104 mEq/L (ref 96–112)
GFR: 84.33 mL/min (ref >60.00)
GLUCOSE: 93 mg/dL (ref 70–99)
Potassium: 3.7 mEq/L (ref 3.5–5.1)
Sodium: 139 mEq/L (ref 135–145)
TOTAL PROTEIN: 7.3 g/dL (ref 6.0–8.3)

## 2018-06-10 NOTE — Progress Notes (Signed)
HPI: This is a very pleasant 62 year old woman who was referred to me by Myles LippsSantiago, Irma M, MD  to evaluate history of elevated liver tests.    Chief complaint is elevated liver tests previously  Adult nurserofessional translator in the room.  Also daughter in the room.  She brought records from [place].  Sounds like ERCP, complicated by subcutaneous air throughout her face, neck, body and legs.  NOt sur if ERCP was successful.  Seems like it was for a bile duct stone.  She was admitted for what sounds like a 10-day stay in a hospital in [place].  Records reviewed that were cut and pasted from PCP note 2 months ago, see those below.  After leaving the hospital in [place] the subcutaneous air slowly improved over 1 or 2 months.  She never had recurrent abdominal pains.  Her weight has been overall stable.  GB was removed 12 years ago.    Old Data Reviewed: ------------------------------------------------------------------------------------------------------------------------------ Below is cut-pasted from 04/2018 PCP office visit where he transcribed [place] records:  Nov 30 2017 Labs WBC 7 Hgb 15.4 Hct 45 Plts 263  glc 113 Na 139 K 4.0 Cl 102 Ca 11.2 (corrected Ca 12.2) PO4 4.2 Mg 1.84  BUN 9.8 crt 0.60 TP 6.6 Alb 3.1 Globulins 3.5 ALP 237 LDH 495 AST 270 ALT 188 GGT 327 TB 1.2 Direct bili 0.6 Indirect bili 0.6 Amylase 33 Lipase 6.9  TC 190 HDL 41 LDL 129 TG 102  In Jan 21 2018, CMP had normalized except for minimally elevated PO4 of 4.8  Biopsy from EGD done on Dec 07 2017:  #1. chronic gastritis no erosive, neg h pylori #2. fragment from gastric mucosal lesion - neg h pylori, no malignancy, probable peptic ulcer  CT abd with contrast on Jan 03 2017 (question date?) Emphysema of soft tissues of thorax and abdomen Bone normal Lung bases and pleura normal Normal liver, homogenous, no dilation of intrahepatic biliary ducts Surgical absence of  gallbladder Pancreatic head and uncinate process enlarged, measuring 4.1cm. There are discrete surrounding inflammatory changes. Body and tail of pancreas are normal. Homogenous density. Normal kidneys Normal bladder Surgical absence of uterus No adnexal masses Spleen normal Stomach normal Small intestine normal Large intestine normal Aorta and iliac arteries are normal There is minimal residual free air in the mesogastic region, right paracolic gutter and left iliac fossa  --------------------------------------------------------------------------------------------------------------------------------------------------------  Labs [place] 04/2018: CBC, cmet, lipase, HbA1c were all normal.     Review of systems: Pertinent positive and negative review of systems were noted in the above HPI section. All other review negative.   Past Medical History:  Diagnosis Date  . Depression     Past Surgical History:  Procedure Laterality Date  . ABDOMINAL HYSTERECTOMY    . BLADDER SURGERY    . CHOLECYSTECTOMY    . THROAT SURGERY      No current outpatient medications on file.   No current facility-administered medications for this visit.     Allergies as of 06/10/2018  . (No Known Allergies)    Family History  Problem Relation Age of Onset  . Heart disease Mother     Social History   Socioeconomic History  . Marital status: Married    Spouse name: Not on file  . Number of children: Not on file  . Years of education: Not on file  . Highest education level: Not on file  Occupational History  . Not on file  Social Needs  . Financial resource strain:  Not on file  . Food insecurity:    Worry: Not on file    Inability: Not on file  . Transportation needs:    Medical: Not on file    Non-medical: Not on file  Tobacco Use  . Smoking status: Never Smoker  . Smokeless tobacco: Never Used  Substance and Sexual Activity  . Alcohol use: No    Alcohol/week: 0.0 oz  . Drug  use: Not Currently  . Sexual activity: Not on file  Lifestyle  . Physical activity:    Days per week: Not on file    Minutes per session: Not on file  . Stress: Not on file  Relationships  . Social connections:    Talks on phone: Not on file    Gets together: Not on file    Attends religious service: Not on file    Active member of club or organization: Not on file    Attends meetings of clubs or organizations: Not on file    Relationship status: Not on file  . Intimate partner violence:    Fear of current or ex partner: Not on file    Emotionally abused: Not on file    Physically abused: Not on file    Forced sexual activity: Not on file  Other Topics Concern  . Not on file  Social History Narrative  . Not on file     Physical Exam: BP 102/64   Pulse 68   Ht 5\' 3"  (1.6 m)   Wt 154 lb (69.9 kg)   BMI 27.28 kg/m  Constitutional: generally well-appearing Psychiatric: alert and oriented x3 Eyes: extraocular movements intact Mouth: oral pharynx moist, no lesions Neck: supple no lymphadenopathy Cardiovascular: heart regular rate and rhythm Lungs: clear to auscultation bilaterally Abdomen: soft, nontender, nondistended, no obvious ascites, no peritoneal signs, normal bowel sounds Extremities: no lower extremity edema bilaterally Skin: no lesions on visible extremities   Assessment and plan: 62 y.o. female with acute illness while in [place] that sounds like retained bile duct stone and complications following an ERCP  Even with a professional translator the history was still difficult and small but important nuances of the history may have been missed.  It does sound like she had gallstone related illness and probably complications following an ERCP that led to a 10-day stay while in [place].  Liver tests done 1 or 2 months ago here were perfectly normal, her weight has been stable, she has had no recurrent abdominal pains and so I suspect that she does not still have a  retained stone however I am not certain of that by any means.  They were told the procedure was not successful and I think that this is in regards to the ERCP.  I recommended MRI with MRCP to answer the question of whether she still has retained stones in her bile duct.  She will also get repeat set of liver testing and CBC today.  I did briefly explained to her that if she does still have retained stones in her bile duct then ERCP is likely to be recommended.  Since she had what sounds like post years to be complications while in [place] she is probably at higher risk for complications with repeat ERCP.   Please see the "Patient Instructions" section for addition details about the plan.   Rob Bunting, MD [facility] 06/10/2018, 10:47 AM  Cc: Myles Lipps, MD

## 2018-06-10 NOTE — Patient Instructions (Addendum)
You will have labs checked today in the basement lab.  Please head down after you check out with the front desk  (cbc, cmet). MRI with MRCP; does she still have bile duct stones?  [facility]. Your appointment time is 4pm. Please arrive 15 minutes prior to your appointment time for registration purposes. Please make certain not to have anything to eat or drink 6 hours prior to your test. In addition, if you have any metal in your body, have a pacemaker or defibrillator, please be sure to let your ordering physician know. This test typically takes 45 minutes to 1 hour to complete. Should you need to reschedule, please call [phone] to do so.  Your provider has requested that you go to the basement level for lab work before leaving today. Press "B" on the elevator. The lab is located at the first door on the left as you exit the elevator.

## 2018-06-16 ENCOUNTER — Other Ambulatory Visit: Payer: Self-pay | Admitting: Gastroenterology

## 2018-06-16 ENCOUNTER — Ambulatory Visit (HOSPITAL_COMMUNITY)
Admission: RE | Admit: 2018-06-16 | Discharge: 2018-06-16 | Disposition: A | Discharge status: Home / Self Care | Payer: BLUE CROSS/BLUE SHIELD | Source: Ambulatory Visit | Attending: Gastroenterology | Admitting: Gastroenterology

## 2018-06-16 DIAGNOSIS — J9811 Atelectasis: Secondary | ICD-10-CM | POA: Diagnosis not present

## 2018-06-16 DIAGNOSIS — K8020 Calculus of gallbladder without cholecystitis without obstruction: Secondary | ICD-10-CM | POA: Diagnosis not present

## 2018-06-16 DIAGNOSIS — I517 Cardiomegaly: Secondary | ICD-10-CM | POA: Diagnosis not present

## 2018-06-16 DIAGNOSIS — K8050 Calculus of bile duct without cholangitis or cholecystitis without obstruction: Secondary | ICD-10-CM | POA: Diagnosis not present

## 2018-06-16 DIAGNOSIS — R935 Abnormal findings on diagnostic imaging of other abdominal regions, including retroperitoneum: Secondary | ICD-10-CM | POA: Diagnosis not present

## 2018-06-16 MED ORDER — GADOBENATE DIMEGLUMINE 529 MG/ML IV SOLN
15.0000 mL | Freq: Once | INTRAVENOUS | Status: AC | PRN
  Administered 2018-06-16: 14 mL via INTRAVENOUS

## 2018-06-26 ENCOUNTER — Telehealth: Payer: Self-pay | Admitting: Gastroenterology

## 2018-06-26 NOTE — Telephone Encounter (Signed)
Advised patient we would get back to her sometime next week after we spoken to physician. They do not speak english and I will be glad to call her back.

## 2018-06-27 NOTE — Progress Notes (Signed)
Casey Brewer  MRN: [id number] DOB: November 02, 1956  Subjective:  Casey Brewer is a 62 y.o. female seen in office today for a chief complaint of "dimples" on labia and anus x 2 days. Lesions on vagina are irritating and itch. Area on anus burns and is painful. Has noticed bright red blood when wiping in the past, not recently. Has had recurrent outbreaks of each for past two years.  15 x in past 2 years. Resolve after about 2 weeks. Happens when she is nervous. Notes she has a rip in the anus after child birth years ago and did not have it repaired. Has experienced this discomfort each time she has to strain to have a BM. Denies blisters, tingling, constipation, nausea, vomiting,and  rectal bleeding. Has tried preparation H and nikzon for hemorrhoids with no full relief. She is sexually active with monogamous husband. No PMH of STD. PMH of hemorrhoids and oral cold sores. Last colonoscopy was 2017. PSH of hysterectomy.   [employer] Spanish Interpreter Casey HesselbachMaria [phone]#[id number] used.   Review of Systems  Constitutional: Negative for chills, diaphoresis and fever.  Genitourinary: Negative for dyspareunia, dysuria, frequency, pelvic pain, vaginal bleeding and vaginal discharge.    There are no active problems to display for this patient.   No current outpatient medications on file prior to visit.   No current facility-administered medications on file prior to visit.     No Known Allergies    Social History   Socioeconomic History  . Marital status: Married    Spouse name: Not on file  . Number of children: Not on file  . Years of education: Not on file  . Highest education level: Not on file  Occupational History  . Not on file  Social Needs  . Financial resource strain: Not on file  . Food insecurity:    Worry: Not on file    Inability: Not on file  . Transportation needs:    Medical: Not on file    Non-medical: Not on file  Tobacco Use  . Smoking status: Never Smoker  .  Smokeless tobacco: Never Used  Substance and Sexual Activity  . Alcohol use: No    Alcohol/week: 0.0 oz  . Drug use: Not Currently  . Sexual activity: Not on file  Lifestyle  . Physical activity:    Days per week: Not on file    Minutes per session: Not on file  . Stress: Not on file  Relationships  . Social connections:    Talks on phone: Not on file    Gets together: Not on file    Attends religious service: Not on file    Active member of club or organization: Not on file    Attends meetings of clubs or organizations: Not on file    Relationship status: Not on file  . Intimate partner violence:    Fear of current or ex partner: Not on file    Emotionally abused: Not on file    Physically abused: Not on file    Forced sexual activity: Not on file  Other Topics Concern  . Not on file  Social History Narrative  . Not on file    Objective:  BP 122/79 (BP Location: Left Arm, Patient Position: Sitting, Cuff Size: Normal)   Pulse 66   Temp 98.6 F (37 C) (Oral)   Ht 5' 0.79" (1.544 m)   Wt 153 lb (69.4 kg)   SpO2 96%   BMI 29.11 kg/m  Physical Exam  Constitutional: She is oriented to person, place, and time. She appears well-developed and well-nourished. No distress.  HENT:  Head: Normocephalic and atraumatic.  Eyes: Conjunctivae are normal.  Neck: Normal range of motion.  Pulmonary/Chest: Effort normal.  Genitourinary: Rectal exam shows fissure (one small fissure at 12 o clock and one larger fissure at 6 o clock,) and tenderness (with palpation of fissures). Rectal exam shows no external hemorrhoid and no internal hemorrhoid.     Neurological: She is alert and oriented to person, place, and time.  Skin: Skin is warm and dry.  Psychiatric: She has a normal mood and affect.  Vitals reviewed.  CMA chaperone present for GU and rectal exam. Assessment and Plan :  1. Condyloma acuminatum in female Hx and PE findings consistent with condyloma. Rec topical therapy.  Appearance is not suspicious for HSV but pt is concerned about this and requests all STD testing and referral to gynecologist at this time. Labs pending. Referral placed.  - imiquimod (ALDARA) 5 % cream; Apply topically 3 (three) times a week. Apply a thin layer 3 times per week (on alternate days) prior to bedtime; leave on skin for 6 to 10 hours, then remove with mild soap and water. Continue until there is total clearance of the genital/perianal warts or for a maximum duration of therapy of 16 weeks.  Dispense: 12 each; Refill: 1  2. Anal fissure Hx and PE findings consistent with anal fissure. Rec topical therapy and avoid straining during BM.  - diltiazem 2 % GEL; Apply a pea-sized amount to the affected area 3 times daily.  Dispense: 30 g; Refill: 0 - Lidocaine, Anorectal, 5 % GEL; Apply a pea-sized amount to the affected area 3 times daily.  Dispense: 30 g; Refill: 0  3. Vaginal lesion Labs pending.  - GC/Chlamydia Probe Amp - Hepatitis panel, acute - HIV antibody - RPR - Trichomonas vaginalis, RNA - HSV(herpes simplex vrs) 1+2 ab-IgG - Ambulatory referral to Gynecology   Benjiman Core PA-C  Primary Care at [facility] 06/28/2018 12:04 PM

## 2018-06-28 ENCOUNTER — Encounter: Payer: Self-pay | Admitting: Physician Assistant

## 2018-06-28 ENCOUNTER — Ambulatory Visit: Payer: BLUE CROSS/BLUE SHIELD | Admitting: Physician Assistant

## 2018-06-28 ENCOUNTER — Other Ambulatory Visit: Payer: Self-pay

## 2018-06-28 VITALS — BP 122/79 | HR 66 | Temp 98.6°F | Ht 60.79 in | Wt 153.0 lb

## 2018-06-28 DIAGNOSIS — K602 Anal fissure, unspecified: Secondary | ICD-10-CM

## 2018-06-28 DIAGNOSIS — N898 Other specified noninflammatory disorders of vagina: Secondary | ICD-10-CM | POA: Diagnosis not present

## 2018-06-28 DIAGNOSIS — A630 Anogenital (venereal) warts: Secondary | ICD-10-CM

## 2018-06-28 MED ORDER — DILTIAZEM GEL 2 %: CUTANEOUS | 0 refills | Status: DC

## 2018-06-28 MED ORDER — LIDOCAINE (ANORECTAL) 5 % EX GEL: CUTANEOUS | 0 refills | Status: DC

## 2018-06-28 MED ORDER — IMIQUIMOD 5 % EX CREA: TOPICAL_CREAM | CUTANEOUS | 1 refills | Status: DC

## 2018-06-28 NOTE — Patient Instructions (Addendum)
Para problemas anales, aplique lidocana y crema diltiazem en el rea afectada 3 veces al da. Evite el estreimiento.  Para la vagina, [place] crema tpica 3 veces a la semana. Siga las instrucciones en el tubo. He colocado una referencia a ginecologa y que deben ponerse en contacto con usted dentro de 2 semanas.  Gracias por dejarme participar en su salud y Health visitor.  For anal issues, apply lidocaine and diltiazem cream to affected area 3 times a day. Avoid straining while using the bathroom.  For vagina, use topical cream 3 times a week. Follow directions on tube. I have placed a referral to gynecology and they should contact you within 2 weeks.  Thank you for letting me participate in your health and well being.  Fisura anal en los adultos (Anal Fissure, Adult) Una fisura anal es un pequeo desgarro o un corte en la piel que rodea el orificio del conducto anal (ano).El sangrado proveniente del desgarro o el corte suele detenerse solo despus de algunos minutos. Es probable que note Psychologist, educational materia fecal, hasta tanto el desgarro o el corte cicatricen. CUIDADOS EN EL HOGAR Comida y bebida  No consuma bananas ni productos lcteos, ya que estos alimentos pueden causar estreimiento.  Beba suficiente lquido para mantener el pis (orina) claro o de color amarillo plido.  Consuma gran cantidad de frutas, cereales integrales y verduras. Instrucciones generales  Mantenga la zona anal tan limpia y seca como sea posible.  Dese un bao de agua tibia (bao de asiento) como se lo haya indicado el mdico. No use jabn para limpiar la zona afectada.  Tome los medicamentos de venta libre y los recetados solamente como se lo haya indicado el mdico.  Use cremas o ungentos solamente como se lo haya indicado el mdico.  Concurra a todas las visitas de control como se lo haya indicado el mdico. Esto es importante. SOLICITE AYUDA SI:  Aumenta el sangrado.  Tiene fiebre.  Tiene heces  acuosas (diarrea) mezcladas con sangre.  Siente dolor.  El problema [place], en lugar de mejorar. Esta informacin no tiene Theme park manager el consejo del mdico. Asegrese de hacerle al mdico cualquier pregunta que tenga. Document Released: 07/18/2011 Document Revised: 08/10/2015 Document Reviewed: 02/14/2015 Elsevier Interactive Patient Education  2018 [employer].  [place] Docker genitales (Genital Warts) Las verrugas genitales son pequeos crecimientos en la zona genital o anal. Son causadas por un tipo de germen (virus del papiloma humano, VPH) que se transmite de Physiological scientist persona a otra durante las [employer]. Se puede transmitir durante el sexo vaginal, anal y oral. Si no se las trata, las verrugas genitales pueden causar otros problemas. CUIDADOS EN EL HOGAR Medicamentos  Aplquese los medicamentos de venta libre y los recetados solamente como se lo haya indicado el mdico.  No use los medicamentos indicados para el tratamiento de las verrugas de las manos.  Pregntele al mdico si puede usar cremas para Associate Professor. Instrucciones generales  No toque ni rasque las verrugas.  No mantenga relaciones sexuales Librarian, academic.  Informe a sus excompaeros sexuales y a los [employer] la afeccin que padece. Tal vez deban recibir tratamiento.  Concurra a todas las visitas de control como se lo haya indicado el mdico. Esto es importante.  Despus del tratamiento, use preservativos durante las [employer]. Otras indicaciones para las mujeres  Es posible que las mujeres que tienen verrugas genitales deban controlarse ms a menudo para Landscape architect presencia de cncer de cuello de tero.  Si queda embarazada, infrmele al mdico que Circuit Citytuvo verrugas genitales, ya que el germen puede transmitirse al feto. SOLICITE AYUDA SI:  Siente dolor en la zona de la piel tratada o esta est enrojecida e hinchada.  Tiene fiebre.  Siente un Advertising copywritermalestar  generalizado.  Palpa bultos en la zona genital o en la anal, o alrededor de estas.  Tiene sangrado en la zona genital o la anal.  Siente dolor durante las relaciones sexuales. Esta informacin no tiene Theme park managercomo fin reemplazar el consejo del mdico. Asegrese de hacerle al mdico cualquier pregunta que tenga. Document Released: 07/18/2011 Document Revised: 08/10/2015 Document Reviewed: 02/14/2015 Elsevier Interactive Patient Education  2018 [employer]     IF you received an x-ray today, you will receive an invoice from [facility]. Please contact [facility] at [phone] with questions or concerns regarding your invoice.   IF you received labwork today, you will receive an invoice from [facility]. Please contact [facility] at [phone] with questions or concerns regarding your invoice.   Our billing staff will not be able to assist you with questions regarding bills from these companies.  You will be contacted with the lab results as soon as they are available. The fastest way to get your results is to activate your My Chart account. Instructions are located on the last page of this paperwork. If you have not heard from us regarding the results in 2 weeks, please contact this office.

## 2018-06-29 LAB — HSV(HERPES SIMPLEX VRS) I + II AB-IGG
HSV 1 Glycoprotein G Ab, IgG: 23.1 index — ABNORMAL HIGH (ref 0.00–0.90)
HSV 2 IgG, Type Spec: 13.9 index — ABNORMAL HIGH (ref 0.00–0.90)

## 2018-06-29 LAB — HEPATITIS PANEL, ACUTE
HEP B C IGM: NEGATIVE
HEP B S AG: NEGATIVE
Hep A IgM: NEGATIVE
Hep C Virus Ab: <0.1 s/co ratio (ref 0.0–0.9)

## 2018-06-29 LAB — HIV ANTIBODY (ROUTINE TESTING W REFLEX): HIV SCREEN 4TH GENERATION: NONREACTIVE

## 2018-06-29 LAB — RPR: RPR: NONREACTIVE

## 2018-06-30 ENCOUNTER — Ambulatory Visit: Payer: BLUE CROSS/BLUE SHIELD | Admitting: Urgent Care

## 2018-06-30 NOTE — Telephone Encounter (Signed)
Dr Jacobs please advise  

## 2018-07-01 LAB — GC/CHLAMYDIA PROBE AMP
CHLAMYDIA, DNA PROBE: NEGATIVE
Neisseria gonorrhoeae by PCR: NEGATIVE

## 2018-07-01 LAB — TRICHOMONAS VAGINALIS, PROBE AMP: Trich vag by NAA: NEGATIVE

## 2018-07-02 ENCOUNTER — Encounter: Payer: Self-pay | Admitting: Family Medicine

## 2018-07-02 ENCOUNTER — Other Ambulatory Visit: Payer: Self-pay

## 2018-07-02 DIAGNOSIS — K8020 Calculus of gallbladder without cholecystitis without obstruction: Secondary | ICD-10-CM

## 2018-07-02 NOTE — Telephone Encounter (Signed)
Let's go ahead with ERCP, next available Thursday MAC day.  I will plan to examine the esophagus for zenkers with standard gastroscope just prior to ERCP.    thanks

## 2018-07-02 NOTE — Telephone Encounter (Signed)
Spoke to daughter Desma Maximntonia an given all the information in spanish. She tells me that she has a family member that speaks and reads english well and will help with prep instruction letter that was mailed. She had no futher questons.

## 2018-07-02 NOTE — Telephone Encounter (Signed)
Casey Brewer, can you please call the pt and let her know I have scheduled her for 07/24/18 at 1 pm [facility].  She will need to arrive at 11:30 am and follow the prep instructions that I have mailed to her.  Please confirm she is not on any blood thinner or diabetes medications. Thank you

## 2018-07-04 NOTE — Telephone Encounter (Signed)
Due to schedule changes the pt will need to be on 07/31/18 at 1030 am to arrive at 9 am and nothing to eat or drink after midnight.  Lady GaryYesi can you please inform the pt?  Thank you

## 2018-07-22 ENCOUNTER — Ambulatory Visit: Payer: BLUE CROSS/BLUE SHIELD | Admitting: Family Medicine

## 2018-07-22 ENCOUNTER — Encounter: Payer: Self-pay | Admitting: Family Medicine

## 2018-07-22 ENCOUNTER — Other Ambulatory Visit: Payer: Self-pay

## 2018-07-22 VITALS — BP 109/72 | HR 66 | Temp 97.9°F | Ht 60.24 in | Wt 155.0 lb

## 2018-07-22 DIAGNOSIS — A6000 Herpesviral infection of urogenital system, unspecified: Secondary | ICD-10-CM

## 2018-07-22 DIAGNOSIS — I83813 Varicose veins of bilateral lower extremities with pain: Secondary | ICD-10-CM

## 2018-07-22 MED ORDER — VALACYCLOVIR HCL 500 MG PO TABS: 500.0000 mg | ORAL_TABLET | Freq: Two times a day (BID) | ORAL | 3 refills | Status: DC

## 2018-07-22 NOTE — Progress Notes (Signed)
 8/20/20195:44 PM  Casey Brewer 02/12/1956, 61 y.o. female [id number]  Chief Complaint  Patient presents with  . Results    here to discuss lab work    HPI:   Patient is a 61 y.o. female who presents today for followup on labs  Seen on 7/27 for vaginal and anal lesions Anal fissures - has responded well to dilt gel Std testing + hsv 1 and 2, has had for many years, comes and go Most recent flare up has resolved Has never taken antivirals rx imiquod which did not help  Also requesting referral to varicose veins specialist Varicose veins upto thighs Painful, burning, no swelling Walking and elevation helps   Fall Risk  07/22/2018 06/28/2018 05/21/2018 04/22/2018  Falls in the past year? No No No No     Depression screen PHQ 2/9 07/22/2018 06/28/2018 05/21/2018  Decreased Interest 0 0 0  Down, Depressed, Hopeless 0 0 0  PHQ - 2 Score 0 0 0    No Known Allergies  Prior to Admission medications   Medication Sig Start Date End Date Taking? Authorizing Provider  diltiazem 2 % GEL Apply a pea-sized amount to the affected area 3 times daily. 06/28/18  Yes Wiseman, Brittany D, PA-C  imiquimod (ALDARA) 5 % cream Apply topically 3 (three) times a week. Apply a thin layer 3 times per week (on alternate days) prior to bedtime; leave on skin for 6 to 10 hours, then remove with mild soap and water. Continue until there is total clearance of the genital/perianal warts or for a maximum duration of therapy of 16 weeks. 06/30/18  Yes Wiseman, Brittany D, PA-C  Lidocaine, Anorectal, 5 % GEL Apply a pea-sized amount to the affected area 3 times daily. 06/28/18  Yes Wiseman, Brittany D, PA-C    Past Medical History:  Diagnosis Date  . Depression     Past Surgical History:  Procedure Laterality Date  . ABDOMINAL HYSTERECTOMY    . BLADDER SURGERY    . CHOLECYSTECTOMY    . THROAT SURGERY      Social History   Tobacco Use  . Smoking status: Never Smoker  . Smokeless tobacco:  Never Used  Substance Use Topics  . Alcohol use: No    Alcohol/week: 0.0 standard drinks    Family History  Problem Relation Age of Onset  . Heart disease Mother     ROS Per hpi  OBJECTIVE:  Blood pressure 109/72, pulse 66, temperature 97.9 F (36.6 C), temperature source Oral, height 5' 0.24" (1.53 m), weight 155 lb (70.3 kg), SpO2 97 %. Body mass index is 30.03 kg/m.   Physical Exam  Constitutional: She is oriented to person, place, and time. She appears well-developed.  HENT:  Head: Normocephalic and atraumatic.  Mouth/Throat: Mucous membranes are normal.  Eyes: Pupils are equal, round, and reactive to light. EOM are normal. No scleral icterus.  Neck: Neck supple.  Pulmonary/Chest: Effort normal.  Neurological: She is alert and oriented to person, place, and time.  Skin: Skin is warm and dry.  Nursing note and vitals reviewed.    ASSESSMENT and PLAN  1. Genital herpes simplex, unspecified site Discussed treatment options, will do valacyclovir  2. Varicose veins of both lower extremities with pain - Ambulatory referral to Vascular Surgery  Other orders - valACYclovir (VALTREX) 500 MG tablet; Take 1 tablet (500 mg total) by mouth 2 (two) times daily.  Return if symptoms worsen or fail to improve.    Casey Tenny M Santiago, MD   [facility] at [facility], [place] [address] Ph.  [phone] Fax [phone]

## 2018-07-22 NOTE — Patient Instructions (Addendum)
If you have lab work done today you will be contacted with your lab results within the next 2 weeks.  If you have not heard from [place] then please contact us. The fastest way to get your results is to register for My Chart.   IF you received an x-ray today, you will receive an invoice from [facility]. Please contact [facility] at [phone] with questions or concerns regarding your invoice.   IF you received labwork today, you will receive an invoice from [facility]. Please contact [facility] at [phone] with questions or concerns regarding your invoice.   Our billing staff will not be able to assist you with questions regarding bills from these companies.  You will be contacted with the lab results as soon as they are available. The fastest way to get your results is to activate your My Chart account. Instructions are located on the last page of this paperwork. If you have not heard from [place] regarding the results in 2 weeks, please contact this office.     Herpes genital Genital Herpes El herpes genital es una infeccin de transmisin sexual (ITS) frecuente causada por un virus. El virus se propaga de Neomia Dear persona a otra a travs del contacto sexual. La infeccin puede causar picazn, ampollas y llagas en los genitales o en el recto. Los sntomas pueden durar [address] y Clinical biochemist. Esto se llama erupcin. Sin embargo, el virus [employer] cuerpo, de modo que es posible que tenga ms erupciones en el futuro. El [employer] las erupciones vara: pueden transcurrir meses o aos. El herpes genital afecta a hombres y mujeres. Es particularmente preocupante para las embarazadas porque el virus puede transmitirse al beb durante el parto y causar problemas graves. El herpes genital tambin es un motivo de preocupacin para las personas que tienen debilitado el sistema encargado de combatir las enfermedades (sistema inmunitario). Cules son las causas? La causa de  esta afeccin es el virus del herpes simple (VHS) tipo1 o tipo2. El virus puede transmitirse a travs de lo siguiente:  El contacto sexual con una persona infectada, incluido el sexo vaginal, anal y oral.  El contacto con el lquido de una llaga de herpes.  La piel. Esto significa que puede contagiarse el herpes de una pareja infectada incluso si la persona no tiene llagas visibles o no sabe que est infectada.  Qu incrementa el riesgo? Es ms probable que desarrolle esta afeccin si:  Tiene relaciones sexuales con muchas parejas.  No [place] preservativos de ltex cuando tiene Clinical research associate.  Cules son los signos o los sntomas? La [employer]ley[employer]son de las personas no presentan sntomas (casos asintomticos) o tienen sntomas leves que pueden confundir con otros problemas de la piel. Entre los sntomas se pueden incluir los siguientes:  Pequeos bultos (protuberancias) rojos cerca de los genitales, del recto o de la boca. Estos bultos se convierten en ampollas y Clinical cytogeneticist.  Sntomas similares a los de la gripe, como: ? Grant Ruts. ? Dolores Temple[employer]land. ? Ganglios linfticos hinchados. ? Dolor de [place].  Dolor al Beatrix Shipper.  Dolor y picazn en la zona genital o en el rea rectal.  Secrecin vaginal.  Hormigueo o dolor punzante en las piernas y en las nalgas.  Por lo general, los sntomas son ms intensos y duran ms durante la primera erupcin (primaria). Los sntomas similares a los de la gripe tambin son ms frecuentes durante la erupcin primaria. Cmo se diagnostica? El herpes genital se puede  diagnosticar en funcin de lo siguiente:  Un examen fsico.  Sus antecedentes mdicos.  Anlisis de [place].  Anlisis de Colombiauna muestra del lquido (cultivo) de Heritage manageruna llaga abierta.  Cmo se trata? No existe ninguna cura para esta afeccin, pero el tratamiento con medicamentos antivirales que se toman por la boca (por va oral) puede lograr lo siguiente:  Camera operatorAcelerar la  recuperacin y Eastman Kodakaliviar los sntomas.  Ayudar a reducir Interior and spatial designerel contagio del virus a las Chief Strategy Officerparejas sexuales.  Limitar las probabilidades de futuras erupciones o reducir su duracin.  Aliviar los sntomas de futuras erupciones.  El mdico tambin puede recomendarle medicamentos para Engineer, materialsaliviar el dolor (analgsicos), como aspirina o ibuprofeno. Siga estas instrucciones en su casa: Actividad sexual  No tenga contacto sexual durante erupciones activas.  Practique el sexo seguro. Los preservativos de ltex y los preservativos femeninos pueden ayudar a Multimedia programmerevitar el contagio del virus del herpes. Instrucciones generales  Mantenga las zonas afectadas secas y limpias.  Tome los medicamentos de venta libre y los recetados solamente como se lo haya indicado el mdico.  Evite frotar o tocar las ampollas y las llagas. Si toca las ampollas o llagas: ? Lvese bien las manos con agua y [place]jabn. ? No se toque los ojos despus de ello.  Para aliviar el dolor o la picazn, puede tomar las siguientes medidas segn las indicaciones del mdico: ? Aplique un pao hmedo y fro (compresa fra) en las zonas afectadas de 4 a 6veces por Futures traderda. ? Aplique una sustancia que protege la piel y reduce el sangrado (astringente). ? Aplique un gel que ayuda a Engineer, materialsaliviar el dolor cerca de las llagas (gel con lidocana). ? Dese un bao tibio de poca profundidad para limpiar la zona genital (bao de asiento).  Concurra a todas las visitas de control como se lo haya indicado el mdico. Esto es importante. Cmo se evita?  Use preservativos. Aunque cualquier persona puede contraer herpes genital durante el contacto sexual, incluso con el uso de un preservativo, el preservativo puede brindar cierta proteccin.  Evite tener mltiples parejas sexuales.  Hable con su pareja sexual acerca de los sntomas que cualquiera de [address], [place]udiera tener. Adems, hable con su pareja acerca de cualquier antecedente de ITS que pudiera tener.  Hgase  pruebas para detectar ITS antes de Management consultanttener relaciones sexuales. Pdale a su pareja que haga lo mismo.  No tenga contacto sexual si tiene sntomas de herpes genital. Comunquese con un mdico si:  Los sntomas no mejoran con los medicamentos.  Los sntomas vuelven a Research officer, trade unionaparecer.  Aparecen nuevos sntomas.  Tiene fiebre.  Siente dolor abdominal.  Presenta enrojecimiento, hinchazn o dolor en el ojo.  Nota llagas nuevas en otras partes del cuerpo.  Es Nurse, learning disabilitymujer y presenta sangrado entre perodos Marysvillemenstruales.  Tuvo herpes y [place]queda embarazada o est pensando en quedar embarazada. Resumen  El herpes genital es una infeccin de transmisin sexual (ITS) frecuente causada por el virus del herpes simple (VHS) tipo1 o tipo2.  Estos virus casi siempre se transmiten a travs del contacto sexual con Neomia Dearuna persona infectada.  Es ms probable que Conservator, museum/gallerypresente esta afeccin si tiene relaciones sexuales con muchas parejas o si tuvo relaciones sexuales sin proteccin.  La Harley-Davidsonmayora de las personas no presentan sntomas (casos asintomticos) o tienen sntomas leves que pueden confundir con otros problemas de la piel. Los sntomas aparecen a medida que se presentan las erupciones con meses o aos de separacin entre Neomia Dearuna y [place]otra.  No existe ninguna cura para esta afeccin, pero el  tratamiento con medicamentos antivirales por va oral puede Asbury Automotive Groupaliviar los sntomas, reducir las probabilidades de Engineering geologistcontagiar el virus a una pareja, evitar futuras erupciones o disminuir la duracin de futuras erupciones. Esta informacin no tiene Theme park managercomo fin reemplazar el consejo del mdico. Asegrese de hacerle al mdico cualquier pregunta que tenga. Document Released: 08/29/2005 Document Revised: 02/22/2017 Document Reviewed: 02/22/2017 Elsevier Interactive Patient Education  [employer]2018 Elsevier Inc.

## 2018-07-22 NOTE — H&P (View-Only) (Signed)
8/20/20195:44 PM  Casey Brewer October 05, 1956, 62 y.o. female [id number]  Chief Complaint  Patient presents with  . Results    here to discuss lab work    HPI:   Patient is a 62 y.o. female who presents today for followup on labs  Seen on 7/27 for vaginal and anal lesions Anal fissures - has responded well to dilt gel Std testing + hsv 1 and 2, has had for many years, comes and go Most recent flare up has resolved Has never taken antivirals rx imiquod which did not help  Also requesting referral to varicose veins specialist Varicose veins upto thighs Painful, burning, no swelling Walking and elevation helps   Fall Risk  07/22/2018 06/28/2018 05/21/2018 04/22/2018  Falls in the past year? No No No No     Depression screen [place] [facility]CHQ 2/9 07/22/2018 06/28/2018 05/21/2018  Decreased Interest 0 0 0  Down, Depressed, Hopeless 0 0 0  PHQ - 2 Score 0 0 0    No Known Allergies  Prior to Admission medications   Medication Sig Start Date End Date Taking? Authorizing Provider  diltiazem 2 % GEL Apply a pea-sized amount to the affected area 3 times daily. 06/28/18  Yes Barnett AbuWiseman, GrenadaBrittany D, PA-C  imiquimod (ALDARA) 5 % cream Apply topically 3 (three) times a week. Apply a thin layer 3 times per week (on alternate days) prior to bedtime; leave on skin for 6 to 10 hours, then remove with mild soap and water. Continue until there is total clearance of the genital/perianal warts or for a maximum duration of therapy of 16 weeks. 06/30/18  Yes Barnett AbuWiseman, [place]Brittany D, PA-C  Lidocaine, Anorectal, 5 % GEL Apply a pea-sized amount to the affected area 3 times daily. 06/28/18  Yes Magdalene RiverWiseman, Brittany D, PA-C    Past Medical History:  Diagnosis Date  . Depression     Past Surgical History:  Procedure Laterality Date  . ABDOMINAL HYSTERECTOMY    . BLADDER SURGERY    . CHOLECYSTECTOMY    . THROAT SURGERY      Social History   Tobacco Use  . Smoking status: Never Smoker  . Smokeless tobacco:  Never Used  Substance Use Topics  . Alcohol use: No    Alcohol/week: 0.0 standard drinks    Family History  Problem Relation Age of Onset  . Heart disease Mother     ROS Per hpi  OBJECTIVE:  Blood pressure 109/72, pulse 66, temperature 97.9 F (36.6 C), temperature source Oral, height 5' 0.24" (1.53 m), weight 155 lb (70.3 kg), SpO2 97 %. Body mass index is 30.03 kg/m.   Physical Exam  Constitutional: She is oriented to person, place, and time. She appears well-developed.  HENT:  Head: Normocephalic and atraumatic.  Mouth/Throat: Mucous membranes are normal.  Eyes: Pupils are equal, round, and reactive to light. EOM are normal. No scleral icterus.  Neck: Neck supple.  Pulmonary/Chest: Effort normal.  Neurological: She is alert and oriented to person, place, and time.  Skin: Skin is warm and dry.  Nursing note and vitals reviewed.    ASSESSMENT and PLAN  1. Genital herpes simplex, unspecified site Discussed treatment options, will do valacyclovir  2. Varicose veins of both lower extremities with pain - Ambulatory referral to Vascular Surgery  Other orders - valACYclovir (VALTREX) 500 MG tablet; Take 1 tablet (500 mg total) by mouth 2 (two) times daily.  Return if symptoms worsen or fail to improve.    Myles LippsIrma M Santiago, MD  [facility] at [facility], [place] [address] Ph.  [phone] Fax [phone]

## 2018-07-31 ENCOUNTER — Ambulatory Visit (HOSPITAL_COMMUNITY): Payer: BLUE CROSS/BLUE SHIELD

## 2018-07-31 ENCOUNTER — Ambulatory Visit (HOSPITAL_COMMUNITY): Payer: BLUE CROSS/BLUE SHIELD | Admitting: Certified Registered Nurse Anesthetist

## 2018-07-31 ENCOUNTER — Telehealth: Payer: Self-pay

## 2018-07-31 ENCOUNTER — Encounter (HOSPITAL_COMMUNITY)
Admission: RE | Disposition: A | Discharge status: Home / Self Care | Payer: Self-pay | Source: Ambulatory Visit | Attending: Gastroenterology

## 2018-07-31 ENCOUNTER — Ambulatory Visit: Payer: BLUE CROSS/BLUE SHIELD | Admitting: Gynecology

## 2018-07-31 ENCOUNTER — Other Ambulatory Visit: Payer: Self-pay

## 2018-07-31 ENCOUNTER — Encounter (HOSPITAL_COMMUNITY): Payer: Self-pay | Admitting: *Deleted

## 2018-07-31 ENCOUNTER — Ambulatory Visit (HOSPITAL_COMMUNITY)
Admission: RE | Admit: 2018-07-31 | Discharge: 2018-07-31 | Disposition: A | Discharge status: Home / Self Care | Payer: BLUE CROSS/BLUE SHIELD | Source: Ambulatory Visit | Attending: Gastroenterology | Admitting: Gastroenterology

## 2018-07-31 DIAGNOSIS — K8020 Calculus of gallbladder without cholecystitis without obstruction: Secondary | ICD-10-CM

## 2018-07-31 DIAGNOSIS — A6000 Herpesviral infection of urogenital system, unspecified: Secondary | ICD-10-CM | POA: Insufficient documentation

## 2018-07-31 DIAGNOSIS — K8050 Calculus of bile duct without cholangitis or cholecystitis without obstruction: Secondary | ICD-10-CM

## 2018-07-31 DIAGNOSIS — K831 Obstruction of bile duct: Secondary | ICD-10-CM

## 2018-07-31 DIAGNOSIS — K219 Gastro-esophageal reflux disease without esophagitis: Secondary | ICD-10-CM | POA: Insufficient documentation

## 2018-07-31 DIAGNOSIS — I83813 Varicose veins of bilateral lower extremities with pain: Secondary | ICD-10-CM

## 2018-07-31 HISTORY — DX: Other complications of anesthesia, initial encounter: T88.59XA

## 2018-07-31 HISTORY — DX: Adverse effect of unspecified anesthetic, initial encounter: T41.45XA

## 2018-07-31 SURGERY — INVASIVE LAB ABORTED CASE
Anesthesia: General

## 2018-07-31 MED ORDER — INDOMETHACIN 50 MG RE SUPP
RECTAL | Status: AC
  Filled 2018-07-31: qty 2

## 2018-07-31 MED ORDER — FENTANYL CITRATE (PF) 100 MCG/2ML IJ SOLN
INTRAMUSCULAR | Status: AC
  Filled 2018-07-31: qty 2

## 2018-07-31 MED ORDER — PROPOFOL 10 MG/ML IV BOLUS
INTRAVENOUS | Status: AC
  Filled 2018-07-31: qty 20

## 2018-07-31 MED ORDER — SUGAMMADEX SODIUM 200 MG/2ML IV SOLN
INTRAVENOUS | Status: DC | PRN
  Administered 2018-07-31: 200 mg via INTRAVENOUS

## 2018-07-31 MED ORDER — PROPOFOL 10 MG/ML IV BOLUS
INTRAVENOUS | Status: AC
  Filled 2018-07-31: qty 60

## 2018-07-31 MED ORDER — PROPOFOL 10 MG/ML IV BOLUS
INTRAVENOUS | Status: DC | PRN
  Administered 2018-07-31: 180 mg via INTRAVENOUS

## 2018-07-31 MED ORDER — FENTANYL CITRATE (PF) 250 MCG/5ML IJ SOLN
INTRAMUSCULAR | Status: DC | PRN
  Administered 2018-07-31: 100 ug via INTRAVENOUS

## 2018-07-31 MED ORDER — ONDANSETRON HCL 4 MG/2ML IJ SOLN
INTRAMUSCULAR | Status: DC | PRN
  Administered 2018-07-31: 4 mg via INTRAVENOUS

## 2018-07-31 MED ORDER — INDOMETHACIN 50 MG RE SUPP
RECTAL | Status: DC | PRN
  Administered 2018-07-31: 100 mg via RECTAL

## 2018-07-31 MED ORDER — ROCURONIUM BROMIDE 10 MG/ML (PF) SYRINGE
PREFILLED_SYRINGE | INTRAVENOUS | Status: DC | PRN
  Administered 2018-07-31: 40 mg via INTRAVENOUS

## 2018-07-31 MED ORDER — SODIUM CHLORIDE 0.9 % IV SOLN: INTRAVENOUS | Status: DC

## 2018-07-31 MED ORDER — GLUCAGON HCL RDNA (DIAGNOSTIC) 1 MG IJ SOLR
INTRAMUSCULAR | Status: DC | PRN
  Administered 2018-07-31: .5 mg via INTRAVENOUS

## 2018-07-31 MED ORDER — CIPROFLOXACIN IN D5W 400 MG/200ML IV SOLN
INTRAVENOUS | Status: AC
  Filled 2018-07-31: qty 200

## 2018-07-31 MED ORDER — GLUCAGON HCL RDNA (DIAGNOSTIC) 1 MG IJ SOLR
INTRAMUSCULAR | Status: AC
  Filled 2018-07-31: qty 1

## 2018-07-31 MED ORDER — LIDOCAINE 2% (20 MG/ML) 5 ML SYRINGE
INTRAMUSCULAR | Status: DC | PRN
  Administered 2018-07-31: 60 mg via INTRAVENOUS

## 2018-07-31 MED ORDER — DEXAMETHASONE SODIUM PHOSPHATE 10 MG/ML IJ SOLN
INTRAMUSCULAR | Status: DC | PRN
  Administered 2018-07-31: 10 mg via INTRAVENOUS

## 2018-07-31 MED ORDER — LACTATED RINGERS IV SOLN
INTRAVENOUS | Status: DC
  Administered 2018-07-31: 10:00:00 via INTRAVENOUS

## 2018-07-31 NOTE — Discharge Instructions (Signed)
Colangiopancreatografa retrgrada endoscpica (CPRE) Cuidados posteriores (Endoscopic Retrograde Cholangiopancreatography (ERCP), Care After) Siga estas instrucciones durante las prximas semanas. Estas indicaciones le proporcionan informacin general acerca de cmo deber cuidarse despus del procedimiento. El mdico tambin podr darle instrucciones ms especficas. El tratamiento se ha planificado de acuerdo a las prcticas mdicas actuales, pero a veces se producen problemas. Comunquese con el mdico si tiene algn problema o tiene dudas despus del procedimiento. QU ESPERAR DESPUS DEL PROCEDIMIENTO Despus del procedimiento, es tpico tener las siguientes sensaciones:  Dance movement psychotherapistDolor en la garganta.  Ganas de vomitar (nuseas).  Hinchazn.  Mareos.  Cansancio. INSTRUCCIONES PARA EL CUIDADO EN EL HOGAR  Pdale a algn amigo o familiar que se quede con usted durante las primeras 24 horas despus del procedimiento.  Comience a tomar sus medicamentos habituales y a comer normalmente tan pronto como se sienta lo suficientemente bien, o segn las indicaciones de su mdico.  SOLICITE ATENCIN MDICA SI:  Siente dolor abdominal.  Tiene signos de infeccin como: ? Escalofros. ? No se siente bien.  SOLICITE ATENCIN MDICA DE INMEDIATO SI:  Tiene dificultad para tragar.  Siente un dolor abdominal, en la garganta o en el pecho cada vez ms intenso.  Vomita.  La materia fecal es negra o tiene [place].  Tiene fiebre.  Esta informacin no tiene Theme park managercomo fin reemplazar el consejo del mdico. Asegrese de hacerle al mdico cualquier pregunta que tenga. Document Released: 09/09/2013 Document Revised: 09/09/2013 Document Reviewed: 05/25/2013 Elsevier Interactive Patient Education  2017 Elsevier Inc.   Endoscopic Retrograde Cholangiopancreatogram, Care After This sheet gives you information about how to care for yourself after your procedure. Your health care provider may also give you more  specific instructions. If you have problems or questions, contact your health care provider. What can I expect after the procedure? After the procedure, it is common to have:  Soreness in your throat.  Nausea.  Bloating.  Dizziness.  Tiredness (fatigue).  Follow these instructions at home:  Take over-the-counter and prescription medicines only as told by your health care provider.  Do not drive for 24 hours if you were given a medicine to help you relax (sedative) during your procedure. Have someone stay with you for 24 hours after the procedure.  Return to your normal activities as told by your health care provider. Ask your health care provider what activities are safe for you.  Return to eating what you normally do as soon as you feel well enough or as told by your health care provider.  Keep all follow-up visits as told by your health care provider. This is important. Contact a health care provider if:  You have pain in your abdomen that does not get better with medicine.  You develop signs of infection, such as: ? Chills. ? Feeling unwell. Get help right away if:  You have difficulty swallowing.  You have worsening pain in your throat, chest, or abdomen.  You vomit bright red blood or a substance that looks like coffee grounds.  You have bloody or very black stools.  You have a fever.  You have a sudden increase in swelling (bloating) in your abdomen. Summary  After the procedure, it is common to feel tired and to have some discomfort in your throat.  Contact your health care provider if you have signs of infection--such as chills or feeling unwell--or if you have pain that does not improve with medicine.  Get help right away if you have trouble swallowing, worsening pain, bloody or black vomit,  bloody or black stools, a fever, or increased swelling in your abdomen.  Keep all follow-up visits as told by your health care provider. This is important. This  information is not intended to replace advice given to you by your health care provider. Make sure you discuss any questions you have with your health care provider. Document Released: 09/09/2013 Document Revised: 10/08/2016 Document Reviewed: 10/08/2016 Elsevier Interactive Patient Education  2017 [employer].

## 2018-07-31 NOTE — Anesthesia Procedure Notes (Signed)
Procedure Name: Intubation Date/Time: 07/31/2018 10:25 AM Performed by: Mitzie Na, CRNA Pre-anesthesia Checklist: Patient identified, Emergency Drugs available, Suction available, Patient being monitored and Timeout performed Patient Re-evaluated:Patient Re-evaluated prior to induction Oxygen Delivery Method: Circle system utilized Preoxygenation: Pre-oxygenation with 100% oxygen Induction Type: IV induction Ventilation: Mask ventilation without difficulty Laryngoscope Size: Mac and 3 Grade View: Grade I Tube type: Oral Tube size: 7.0 mm Number of attempts: 1 Airway Equipment and Method: Stylet Placement Confirmation: ETT inserted through vocal cords under direct vision,  positive ETCO2 and breath sounds checked- equal and bilateral Secured at: 23 cm Tube secured with: Tape Dental Injury: Teeth and Oropharynx as per pre-operative assessment

## 2018-07-31 NOTE — Telephone Encounter (Signed)
-----   Message from Rachael Feeaniel P Jacobs, MD sent at 07/31/2018 12:51 PM EDT ----- Will do. Thanks  Chonte Ricke,  Can you get in touch with her and offer repeat ERCP attempt with Gabe.   Her daughter is not sure she'll go ahead with repeat ERCP since I could not 'guarantee' that it will be successful.  It wouldn't be too unreasonable to simply follow her clinically, checking LFTs over time.  THanks   ----- Message ----- From: Lemar LoftyMansouraty, Gabriel Jr., MD Sent: 07/31/2018  12:21 PM EDT To: Rachael Feeaniel P Jacobs, MD  Dan, Sounds good.   Willliam Pettet, happy to give it a try as a direct ERCP in the next month.   [facility] and family. Thanks. Gabe ----- Message ----- From: Rachael FeeJacobs, Daniel P, MD Sent: 07/31/2018  11:36 AM EDT To: Lemar LoftyGabriel Mansouraty Jr., MD  Gabe, Hoping to get your input/help with this woman. See my note on her from 1-2 months ago.  She had remote cholecystectomy. [place] in [place] for I believe symptomatic bile duct stone. ERCP was attempted but she suffered some type of complication/perf that led to subcut air throughout her chest, neck.  She fortunately recovered but was told that the stone was still in place.  LFTS now are normal. MRI suggests a likely distal CBD stone (not definitive however).  I did ERCP today, see my report. The major papilla is small and hooded and I could not cannulate her bile duct.  Wondering if you would consider giving it a try sometime in the next few weeks or so.  Let me know what you think.   Thanks DJ

## 2018-07-31 NOTE — Transfer of Care (Signed)
Immediate Anesthesia Transfer of Care Note  Patient: Casey Brewer  Procedure(s) Performed: Incomplete ERCP  Patient Location: Endoscopy Unit  Anesthesia Type:General  Level of Consciousness: drowsy and patient cooperative  Airway & Oxygen Therapy: Patient Spontanous Breathing and Patient connected to face mask oxygen  Post-op Assessment: Report given to RN and Post -op Vital signs reviewed and stable  Post vital signs: Reviewed and stable  Last Vitals:  Vitals Value Taken Time  BP 135/76 07/31/2018 11:47 AM  Temp    Pulse 84 07/31/2018 11:47 AM  Resp 20 07/31/2018 11:47 AM  SpO2 100 % 07/31/2018 11:47 AM  Vitals shown include unvalidated device data.  Last Pain:  Vitals:   07/31/18 0940  TempSrc: Oral  PainSc: 0-No pain         Complications: No apparent anesthesia complications

## 2018-07-31 NOTE — Anesthesia Preprocedure Evaluation (Addendum)
Anesthesia Evaluation  Patient identified by MRN, date of birth, ID band Patient awake    Reviewed: Allergy & Precautions, NPO status , Patient's Chart, lab work & pertinent test results  History of Anesthesia Complications Negative for: history of anesthetic complications  Airway Mallampati: II  TM Distance: >3 FB Neck ROM: Full    Dental  (+) Teeth Intact   Pulmonary neg pulmonary ROS,    breath sounds clear to auscultation       Cardiovascular negative cardio ROS   Rhythm:Regular     Neuro/Psych PSYCHIATRIC DISORDERS Depression negative neurological ROS     GI/Hepatic Neg liver ROS, GERD  Medicated,bile duct stones   Endo/Other  negative endocrine ROS  Renal/GU negative Renal ROS     Musculoskeletal   Abdominal   Peds  Hematology negative hematology ROS (+)   Anesthesia Other Findings   Reproductive/Obstetrics                            Anesthesia Physical Anesthesia Plan  ASA: II  Anesthesia Plan: General   Post-op Pain Management:    Induction: Intravenous  PONV Risk Score and Plan: 2  Airway Management Planned: Oral ETT  Additional Equipment: None  Intra-op Plan:   Post-operative Plan: Extubation in OR  Informed Consent: I have reviewed the patients History and Physical, chart, labs and discussed the procedure including the risks, benefits and alternatives for the proposed anesthesia with the patient or authorized representative who has indicated his/her understanding and acceptance.   Dental advisory given  Plan Discussed with: CRNA and Surgeon  Anesthesia Plan Comments:         Anesthesia Quick Evaluation

## 2018-07-31 NOTE — Interval H&P Note (Signed)
History and Physical Interval Note:  07/31/2018 10:07 AM  Casey Brewer  has presented today for surgery, with the diagnosis of bile duct stones  The various methods of treatment have been discussed with the patient and family. After consideration of risks, benefits and other options for treatment, the patient has consented to  Procedure(s): ENDOSCOPIC RETROGRADE CHOLANGIOPANCREATOGRAPHY (ERCP) WITH PROPOFOL (N/A) as a surgical intervention .  The patient's history has been reviewed, patient examined, no change in status, stable for surgery.  I have reviewed the patient's chart and labs.  Questions were answered to the patient's satisfaction.     Rachael Feeaniel P Jacobs

## 2018-07-31 NOTE — Op Note (Signed)
[facility] Patient Name: Casey Brewer Procedure Date: 07/31/2018 MRN: [id number] Attending MD: Rachael Fee , MD Date of Birth: Mar 08, 1956 CSN: [id number] Age: 62 Admit Type: Outpatient Procedure:                ERCP Indications:              2019 ERCP in [place] for suspected bile duct stone                            c/b a perforation of some sort leading to extensive                            subcutaneous air, pancreatitis. Recent LFTs normal,                            Recent MRI/MRCP suggested persisent CBD stone Providers:                Rachael Fee, MD, Norman Clay, RN, Beryle Beams,                            Technician Referring MD:              Medicines:                General Anesthesia, Indomethacin 100 mg PR Complications:            No immediate complications. Estimated blood loss:                            None Estimated Blood Loss:     Estimated blood loss: none. Procedure:                Pre-Anesthesia Assessment:                           - Prior to the procedure, a History and Physical                            was performed, and patient medications and                            allergies were reviewed. The patient's tolerance of                            previous anesthesia was also reviewed. The risks                            and benefits of the procedure and the sedation                            options and risks were discussed with the patient.                            All questions were answered, and informed consent  was obtained. Prior Anticoagulants: The patient has                            taken no previous anticoagulant or antiplatelet                            agents. ASA Grade Assessment: II - A patient with                            mild systemic disease. After reviewing the risks                            and benefits, the patient was deemed in   satisfactory condition to undergo the procedure.                           After obtaining informed consent, the scope was                            passed under direct vision. Throughout the                            procedure, the patient's blood pressure, pulse, and                            oxygen saturations were monitored continuously. The                            [id number]-[id number] ([id number] Olympus ERCP was introduced                            through the mouth, and used to inject contrast into                            and used to inject contrast into the dorsal and                            ventral pancreatic ducts. The [id number]-[id number] ([id number])                            Olympus adult endoscope was introduced through the                            and used to inject contrast into. The ERCP was                            accomplished without difficulty. The patient                            tolerated the procedure well. Scope In: Scope Out: Findings:      A scout film of the abdomen was obtained. Surgical clips, consistent       with a previous cholecystectomy, were seen in the area of the right  upper quadrant of the abdomen. Using a standard adult gastroscope I       performed EGD with special attention to the proximal esophagus. There       was no observable Zenker's diverticulum and the remaining UGI tract was       normal and so I proceed with ERCP. The esophagus was successfully       intubated under direct vision with a duodenoscope. The scope was       advanced to a normal major papilla in the descending duodenum without       detailed examination of the pharynx, larynx and associated structures,       and upper GI tract. The major papilla was small and hooded. I was unable       to cannulate the bile duct despite multiple attempts, trial of two       different size wires (.035, .025) and different cannula angles. I       briefly cannulated the main pancreatic duct but did  not inject dye.       After 45-28minutes trial the procedure was aborted. Impression:               No obvious proximal esophageal pathology (Zenker's                            diverticulum) that could account for her post ERCP                            complication in [place].                           The major papilla was small and hooded and I was                            unable to cannulate the bile duct. Moderate Sedation:      N/A- Per Anesthesia Care Recommendation:           - Discharge patient to home (ambulatory).                           - I will discuss repeat ERCP attempt with Dr.                            Meridee Score. Procedure Code(s):        --- Professional ---                           [phone], Endoscopic retrograde                            cholangiopancreatography (ERCP); diagnostic,                            including collection of specimen(s) by brushing or                            washing, when performed (separate procedure) Diagnosis Code(s):        --- Professional ---  K80.50, Calculus of bile duct without cholangitis                            or cholecystitis without obstruction CPT copyright 2017 American Medical Association. All rights reserved. The codes documented in this report are preliminary and upon coder review may  be revised to meet current compliance requirements. Rachael Fee, MD 07/31/2018 11:35:18 AM This report has been signed electronically. Number of Addenda: 0

## 2018-08-02 ENCOUNTER — Encounter (HOSPITAL_COMMUNITY): Payer: Self-pay | Admitting: Gastroenterology

## 2018-08-02 NOTE — Anesthesia Postprocedure Evaluation (Signed)
Anesthesia Post Note  Patient: Casey Brewer  Procedure(s) Performed: Incomplete ERCP     Patient location during evaluation: Endoscopy Anesthesia Type: General Level of consciousness: awake and alert Pain management: pain level controlled Vital Signs Assessment: post-procedure vital signs reviewed and stable Respiratory status: spontaneous breathing, nonlabored ventilation, respiratory function stable and patient connected to nasal cannula oxygen Cardiovascular status: blood pressure returned to baseline and stable Postop Assessment: no apparent nausea or vomiting Anesthetic complications: no    Last Vitals:  Vitals:   07/31/18 1200 07/31/18 1210  BP: (!) 141/77 (!) 145/88  Pulse: 69 64  Resp: 16 13  Temp:    SpO2: 100% 99%    Last Pain:  Vitals:   07/31/18 1150  TempSrc: Oral  PainSc:                  Casey Brewer

## 2018-08-05 ENCOUNTER — Other Ambulatory Visit: Payer: Self-pay | Admitting: Family Medicine

## 2018-08-06 ENCOUNTER — Other Ambulatory Visit: Payer: Self-pay

## 2018-08-06 DIAGNOSIS — K8050 Calculus of bile duct without cholangitis or cholecystitis without obstruction: Secondary | ICD-10-CM

## 2018-08-06 DIAGNOSIS — K8020 Calculus of gallbladder without cholecystitis without obstruction: Secondary | ICD-10-CM

## 2018-08-06 NOTE — Telephone Encounter (Signed)
ERCP scheduled, pt instructed and medications reviewed.  Patient instructions mailed to home.  Patient to call with any questions or concerns.  

## 2018-08-06 NOTE — Telephone Encounter (Signed)
Lady Gary do you mind calling the pt with 9/30 Casey Brewer ERCP appt at 1030 am to arrive at 9 am.  Nothing to eat or drink after midnight.  Thank you I will also mail a copy to the home address

## 2018-08-26 ENCOUNTER — Encounter: Payer: Self-pay | Admitting: Family Medicine

## 2018-08-26 ENCOUNTER — Other Ambulatory Visit: Payer: Self-pay

## 2018-08-26 ENCOUNTER — Ambulatory Visit (INDEPENDENT_AMBULATORY_CARE_PROVIDER_SITE_OTHER): Payer: BLUE CROSS/BLUE SHIELD | Admitting: Family Medicine

## 2018-08-26 VITALS — BP 110/70 | HR 70 | Temp 98.2°F | Ht 62.0 in | Wt 156.6 lb

## 2018-08-26 DIAGNOSIS — R05 Cough: Secondary | ICD-10-CM

## 2018-08-26 DIAGNOSIS — R059 Cough, unspecified: Secondary | ICD-10-CM

## 2018-08-26 DIAGNOSIS — J069 Acute upper respiratory infection, unspecified: Secondary | ICD-10-CM | POA: Diagnosis not present

## 2018-08-26 MED ORDER — AZITHROMYCIN 250 MG PO TABS: ORAL_TABLET | ORAL | 0 refills | Status: DC

## 2018-08-26 NOTE — Patient Instructions (Addendum)
Mucinex for cough, cepacol for sore throat. Make sure to drink plenty of fluids and rest.  If symptoms are not improving later this week, start antibiotic that was prescribed.  If any fevers, shortness of breath, or worsening symptoms, please return sooner or emergency room.    Dolor de garganta (Sore Throat) El dolor de garganta es dolor, ardor, irritacin o sensacin de picazn en la garganta. Cuando usted tiene Engineer, miningdolor de Advertising copywritergarganta, puede sentir molestia o dolor en la garganta cuando traga o habla. Muchas cosas pueden causar dolor de garganta, entre ellas:  Una infeccin.  Alergias estacionales.  La sequedad en el aire.  Algunos irritantes, como el humo o la polucin.  Enfermedad por reflujo gastroesofgico (ERGE).  Un tumor. Generalmente es Financial risk analystel primer signo de otra enfermedad. Un dolor de garganta puede estar acompaado de otros sntomas, como tos, estornudos, fiebre y ganglios hinchados en el cuello. La mayor parte de los dolores de garganta desaparecen sin tratamiento mdico. INSTRUCCIONES PARA EL CUIDADO EN EL World Fuel Services CorporationHOGAR  Tome los medicamentos de venta libre solamente como se lo haya indicado el mdico.  Beba suficiente lquido para Pharmacologistmantener la orina clara o de color amarillo plido.  Descanse todo lo que sea necesario.  Para ayudar a Engineer, materialsaliviar el dolor, intente lo siguiente: ? Beba lquidos calientes, como caldos, infusiones o agua caliente. ? Tambin puede comer o beber lquidos fros o congelados, tales como paletas de hielo congelado. ? Haga grgaras con una mezcla de agua y sal 3 o 4veces al da, o cuando sea necesario. Para preparar la mezcla de agua y sal, disuelva totalmente de media a 1cucharadita de sal en 1taza de agua tibia. ? Chupar caramelos duros o pastillas para la garganta. ? Ponga un humidificador de vapor fro en la habitacin por la noche para humedecer el aire. ? Tambin puede abrir el agua caliente de la ducha y sentarse en el bao con la puerta cerrada  durante [id number].  No consuma ningn producto que contenga tabaco, lo que incluye cigarrillos, tabaco de Theatre managermascar y Administrator, Civil Servicecigarrillos electrnicos. Si necesita ayuda para dejar de fumar, consulte al mdico. SOLICITE ATENCIN MDICA SI:  Tiene fiebre por ms de 2 a 3 das.  Tiene fiebre o sntomas que duran (son persistentes) ms de 2 a 3 das.  El dolor de garganta no mejora en el trmino de [address] das.  Tiene fiebre y los sntomas empeoran repentinamente. SOLICITE ATENCIN MDICA DE INMEDIATO SI:  Tiene dificultad para respirar.  No puede tragar lquidos, alimentos blandos o la saliva.  Tiene ms inflamacin en la garganta o en el cuello.  Tiene nuseas o vmitos persistentes. Esta informacin no tiene Theme park managercomo fin reemplazar el consejo del mdico. Asegrese de hacerle al mdico cualquier pregunta que tenga. Document Released: 11/19/2005 Document Revised: 03/12/2016 Document Reviewed: 09/09/2015 Elsevier Interactive Patient Education  2018 [employer]  Tos en los adultos Cough, Adult La tos es un reflejo que despeja la garganta y las vas respiratorias. Toser ayuda a curar y Jabil Circuitproteger los pulmones. Es normal toser a Occupational psychologistveces, pero si la tos aparece junto con otros sntomas o si dura Con-waymucho tiempo, puede indicar una enfermedad que necesita Adamsburgtratamiento. La tos puede durar solo 2 o 3semanas (aguda) o ms de 8semanas (crnica). Cules son las causas? Por lo general, las causas de la tos son las siguientes:  Aspirar sustancias que Sealed Air Corporationirritan los pulmones.  Una infeccin respiratoria de origen viral o bacteriano.  Alergias.  Asma.  Goteo posnasal.  Fumar.  cido que vuelve del  estmago hacia el esfago (reflujo gastroesofgico ).  Ciertos medicamentos.  Enfermedades pulmonares crnicas, incluida la EPOC (o rara vez, cncer de pulmn).  Otras afecciones, por ejemplo, insuficiencia cardaca.  Siga estas indicaciones en su casa: Est atento a cualquier cambio en los sntomas. Tome  estas medidas para Paramedic las molestias:  Tome los medicamentos solamente como se lo haya indicado el mdico. ? Si le recetaron un antibitico, tmelo como se lo haya indicado el mdico. No deje de tomar los antibiticos aunque comience a Actor. ? Hable con el mdico antes de tomar un medicamento para la tos (antitusivo).  Beba suficiente lquido para Photographer orina clara o de color amarillo plido.  Si el aire est seco, use un vaporizador o humidificador de niebla fra en la habitacin o en la casa para ayudar a aflojar las secreciones.  Evite todo lo que le provoque tos en el trabajo o en su casa.  Si la tos empeora por la noche, pruebe a dormir en posicin semierguida.  Evite el humo del cigarrillo. Si fuma, deje de hacerlo. Si necesita ayuda para dejar de fumar, consulte al mdico.  Evite la cafena.  Evite el alcohol.  Descanse todo lo que sea necesario.  Comunquese con un mdico si:  Aparecen nuevos sntomas.  Tose y escupe pus.  La tos no mejora despus de 2 o 3semanas o empeora.  No puede controlar la tos con antitusivos y no puede dormir debido a Secretary/administrator.  Siente un dolor que empeora o que no puede controlar con medicamentos.  Tiene fiebre.  Baja de peso sin causa aparente.  Tiene transpiracin nocturna. Solicite ayuda de inmediato si:  Tose y [employer].  Tiene dificultad para respirar.  El [employer] late [place] rpido. Esta informacin no tiene Theme park manager el consejo del mdico. Asegrese de hacerle al mdico cualquier pregunta que tenga. Document Released: 06/27/2011 Document Revised: 02/21/2017 Document Reviewed: 01/26/2015 Elsevier Interactive Patient Education  [employer].      If you have lab work done today you will be contacted with your lab results within the next 2 weeks.  If you have not heard from [place] then please contact us. The fastest way to get your results is to register for My Chart.   IF you received an  x-ray today, you will receive an invoice from [facility]. Please contact [facility] at [phone] with questions or concerns regarding your invoice.   IF you received labwork today, you will receive an invoice from [facility]. Please contact [facility] at [phone] with questions or concerns regarding your invoice.   Our billing staff will not be able to assist you with questions regarding bills from these companies.  You will be contacted with the lab results as soon as they are available. The fastest way to get your results is to activate your My Chart account. Instructions are located on the last page of this paperwork. If you have not heard from [place] regarding the results in 2 weeks, please contact this office.

## 2018-08-26 NOTE — Progress Notes (Signed)
Subjective:  By signing my name below, I, Stann Ore, attest that this documentation has been prepared under the direction and in the presence of Meredith Staggers, MD. Electronically Signed: Stann Ore, Scribe. 08/26/2018 , 5:20 PM .  Patient was seen in Room 14 .   Patient ID: Donatella Walski, female    DOB: 1955/12/17, 62 y.o.   MRN: [id number] Chief Complaint  Patient presents with  . Sore Throat    since this past friday    HPI Leyanna Shley Dolby is a 62 y.o. female Patient complains of sore throat with nasal congestion and yellow productive cough that started about 4 days ago. She noticed coughing up yellow mucus starting this morning. She reports worst symptom today is her cough. She denies any fevers or shortness of breath. She denies any known sick contact at home. She denies lung disease or asthma. She had similar symptoms a year ago and was diagnosed with bronchitis. She's been taking OTC cough medication from [place] which contains acetaminophen, dextromethorphan, and chlorpheniramine.   She has ERCP scheduled for next Monday, Sept 30th.  Her primary language is Spanish; her daughter translated in the room.   Patient Active Problem List   Diagnosis Date Noted  . Bile duct stone    Past Medical History:  Diagnosis Date  . Complication of anesthesia    "air escaped from hole in my nose and was in my face"  . Depression    Past Surgical History:  Procedure Laterality Date  . ABDOMINAL HYSTERECTOMY    . BLADDER SURGERY    . CHOLECYSTECTOMY    . THROAT SURGERY     Allergies  Allergen Reactions  . Dipyrone Palpitations   Prior to Admission medications   Medication Sig Start Date End Date Taking? Authorizing Provider  diltiazem 2 % GEL Apply a pea-sized amount to the affected area 3 times daily. 06/28/18   Benjiman Core D, PA-C  imiquimod (ALDARA) 5 % cream Apply topically 3 (three) times a week. Apply a thin layer 3 times per week (on alternate days)  prior to bedtime; leave on skin for 6 to 10 hours, then remove with mild soap and water. Continue until there is total clearance of the genital/perianal warts or for a maximum duration of therapy of 16 weeks. 06/30/18   Benjiman Core D, PA-C  Lidocaine, Anorectal, 5 % GEL Apply a pea-sized amount to the affected area 3 times daily. 06/28/18   Benjiman Core D, PA-C  omeprazole (PRILOSEC) 40 MG capsule Take 40 mg by mouth daily.    [provider]  PRESCRIPTION MEDICATION Take 7.5 mg by mouth at bedtime. Adepsique 15mg  tablet    [provider]  valACYclovir (VALTREX) 500 MG tablet TAKE 1 TABLET BY MOUTH TWICE DAILY 08/06/18   Myles Lipps, MD   Social History   Socioeconomic History  . Marital status: Married    Spouse name: Not on file  . Number of children: Not on file  . Years of education: Not on file  . Highest education level: Not on file  Occupational History  . Not on file  Social Needs  . Financial resource strain: Not on file  . Food insecurity:    Worry: Not on file    Inability: Not on file  . Transportation needs:    Medical: Not on file    Non-medical: Not on file  Tobacco Use  . Smoking status: Never Smoker  . Smokeless tobacco: Never Used  Substance  and Sexual Activity  . Alcohol use: No    Alcohol/week: 0.0 standard drinks  . Drug use: Not Currently  . Sexual activity: Not on file  Lifestyle  . Physical activity:    Days per week: Not on file    Minutes per session: Not on file  . Stress: Not on file  Relationships  . Social connections:    Talks on phone: Not on file    Gets together: Not on file    Attends religious service: Not on file    Active member of club or organization: Not on file    Attends meetings of clubs or organizations: Not on file    Relationship status: Not on file  . Intimate partner violence:    Fear of current or ex partner: Not on file    Emotionally abused: Not on file    Physically abused: Not on file      Forced sexual activity: Not on file  Other Topics Concern  . Not on file  Social History Narrative  . Not on file   Review of Systems  Constitutional: Negative for chills, fatigue, fever and unexpected weight change.  HENT: Positive for sore throat.   Respiratory: Negative for cough, shortness of breath and wheezing.   Gastrointestinal: Negative for constipation, diarrhea, nausea and vomiting.  Skin: Negative for rash and wound.  Neurological: Negative for dizziness, weakness and headaches.       Objective:   Physical Exam  Constitutional: She is oriented to person, place, and time. She appears well-developed and well-nourished. No distress.  HENT:  Head: Normocephalic and atraumatic.  Right Ear: Hearing, tympanic membrane, external ear and ear canal normal.  Left Ear: Hearing, tympanic membrane, external ear and ear canal normal.  Nose: Nose normal. Right sinus exhibits no maxillary sinus tenderness and no frontal sinus tenderness. Left sinus exhibits no maxillary sinus tenderness and no frontal sinus tenderness.  Mouth/Throat: Oropharynx is clear and moist. No oropharyngeal exudate.  Eyes: Pupils are equal, round, and reactive to light. Conjunctivae and EOM are normal.  Cardiovascular: Normal rate, regular rhythm, normal heart sounds and intact distal pulses.  No murmur heard. Pulmonary/Chest: Effort normal and breath sounds normal. No respiratory distress. She has no wheezes. She has no rhonchi.  Neurological: She is alert and oriented to person, place, and time.  Skin: Skin is warm and dry. No rash noted.  Psychiatric: She has a normal mood and affect. Her behavior is normal.  Vitals reviewed.   Vitals:   08/26/18 1634  BP: 110/70  Pulse: 70  Temp: 98.2 F (36.8 C)  TempSrc: Oral  SpO2: 96%  Weight: 156 lb 9.6 oz (71 kg)  Height: 5\' 2"  (1.575 m)       Assessment & Plan:    Liann Shatavia Santor is a 62 y.o. female Cough - Plan: azithromycin (ZITHROMAX) 250 MG  table Acute upper respiratory infection  -Likely viral at present, but reports some lower respiratory symptoms, upcoming surgery.  History of bronchitis.  -Start with symptomatic care, Mucinex, Cepacol, plenty of fluids and rest.  If not improving throughout the week, antibiotic prescribed for possible early lower respiratory tract infection.  RTC/ER precautions if worsening  Meds ordered this encounter  Medications  . azithromycin (ZITHROMAX) 250 MG tablet    Sig: Take 2 pills by mouth on day 1, then 1 pill by mouth per day on days 2 through 5.    Dispense:  6 tablet    Refill:  0  Patient Instructions     Mucinex for cough, cepacol for sore throat. Make sure to drink plenty of fluids and rest.  If symptoms are not improving later this week, start antibiotic that was prescribed.  If any fevers, shortness of breath, or worsening symptoms, please return sooner or emergency room.    Dolor de garganta (Sore Throat) El dolor de garganta es dolor, ardor, irritacin o sensacin de picazn en la garganta. Cuando usted tiene Engineer, miningdolor de Advertising copywritergarganta, puede sentir molestia o dolor en la garganta cuando traga o habla. Muchas cosas pueden causar dolor de garganta, entre ellas:  Una infeccin.  Alergias estacionales.  La sequedad en el aire.  Algunos irritantes, como el humo o la polucin.  Enfermedad por reflujo gastroesofgico (ERGE).  Un tumor. Generalmente es Financial risk analystel primer signo de otra enfermedad. Un dolor de garganta puede estar acompaado de otros sntomas, como tos, estornudos, fiebre y ganglios hinchados en el cuello. La mayor parte de los dolores de garganta desaparecen sin tratamiento mdico. INSTRUCCIONES PARA EL CUIDADO EN EL World Fuel Services CorporationHOGAR  Tome los medicamentos de venta libre solamente como se lo haya indicado el mdico.  Beba suficiente lquido para Pharmacologistmantener la orina clara o de color amarillo plido.  Descanse todo lo que sea necesario.  Para ayudar a Engineer, materialsaliviar el dolor, intente lo  siguiente: ? Beba lquidos calientes, como caldos, infusiones o agua caliente. ? Tambin puede comer o beber lquidos fros o congelados, tales como paletas de hielo congelado. ? Haga grgaras con una mezcla de agua y sal 3 o 4veces al da, o cuando sea necesario. Para preparar la mezcla de agua y sal, disuelva totalmente de media a 1cucharadita de sal en 1taza de agua tibia. ? Chupar caramelos duros o pastillas para la garganta. ? Ponga un humidificador de vapor fro en la habitacin por la noche para humedecer el aire. ? Tambin puede abrir el agua caliente de la ducha y sentarse en el bao con la puerta cerrada durante 5a2910minutos.  No consuma ningn producto que contenga tabaco, lo que incluye cigarrillos, tabaco de Theatre managermascar y Administrator, Civil Servicecigarrillos electrnicos. Si necesita ayuda para dejar de fumar, consulte al mdico. SOLICITE ATENCIN MDICA SI:  Tiene fiebre por ms de 2 a 3 das.  Tiene fiebre o sntomas que duran (son persistentes) ms de 2 a 3 das.  El dolor de garganta no mejora en el trmino de [address] das.  Tiene fiebre y los sntomas empeoran repentinamente. SOLICITE ATENCIN MDICA DE INMEDIATO SI:  Tiene dificultad para respirar.  No puede tragar lquidos, alimentos blandos o la saliva.  Tiene ms inflamacin en la garganta o en el cuello.  Tiene nuseas o vmitos persistentes. Esta informacin no tiene Theme park managercomo fin reemplazar el consejo del mdico. Asegrese de hacerle al mdico cualquier pregunta que tenga. Document Released: 11/19/2005 Document Revised: 03/12/2016 Document Reviewed: 09/09/2015 Elsevier Interactive Patient Education  2018 [employer]  Tos en los adultos Cough, Adult La tos es un reflejo que despeja la garganta y las vas respiratorias. Toser ayuda a curar y Jabil Circuitproteger los pulmones. Es normal toser a Occupational psychologistveces, pero si la tos aparece junto con otros sntomas o si dura Con-waymucho tiempo, puede indicar una enfermedad que necesita Fairfaxtratamiento. La tos puede durar solo 2 o  3semanas (aguda) o ms de 8semanas (crnica). Cules son las causas? Por lo general, las causas de la tos son las siguientes:  Aspirar sustancias que Sealed Air Corporationirritan los pulmones.  Una infeccin respiratoria de origen viral o bacteriano.  Alergias.  Asma.  Goteo posnasal.  Fumar.  cido que vuelve del estmago hacia el esfago (reflujo gastroesofgico ).  Ciertos medicamentos.  Enfermedades pulmonares crnicas, incluida la EPOC (o rara vez, cncer de pulmn).  Otras afecciones, por ejemplo, insuficiencia cardaca.  Siga estas indicaciones en su casa: Est atento a cualquier cambio en los sntomas. Tome estas medidas para Paramedic las molestias:  Tome los medicamentos solamente como se lo haya indicado el mdico. ? Si le recetaron un antibitico, tmelo como se lo haya indicado el mdico. No deje de tomar los antibiticos aunque comience a Actor. ? Hable con el mdico antes de tomar un medicamento para la tos (antitusivo).  Beba suficiente lquido para Photographer orina clara o de color amarillo plido.  Si el aire est seco, use un vaporizador o humidificador de niebla fra en la habitacin o en la casa para ayudar a aflojar las secreciones.  Evite todo lo que le provoque tos en el trabajo o en su casa.  Si la tos empeora por la noche, pruebe a dormir en posicin semierguida.  Evite el humo del cigarrillo. Si fuma, deje de hacerlo. Si necesita ayuda para dejar de fumar, consulte al mdico.  Evite la cafena.  Evite el alcohol.  Descanse todo lo que sea necesario.  Comunquese con un mdico si:  Aparecen nuevos sntomas.  Tose y escupe pus.  La tos no mejora despus de 2 o 3semanas o empeora.  No puede controlar la tos con antitusivos y no puede dormir debido a Secretary/administrator.  Siente un dolor que empeora o que no puede controlar con medicamentos.  Tiene fiebre.  Baja de peso sin causa aparente.  Tiene transpiracin nocturna. Solicite ayuda de inmediato  si:  Tose y [employer].  Tiene dificultad para respirar.  [employer] late [place] rpido. Esta informacin no tiene Theme park manager el consejo del mdico. Asegrese de hacerle al mdico cualquier pregunta que tenga. Document Released: 06/27/2011 Document Revised: 02/21/2017 Document Reviewed: 01/26/2015 Elsevier Interactive Patient Education  [employer].      If you have lab work done today you will be contacted with your lab results within the next 2 weeks.  If you have not heard from [place] then please contact us. The fastest way to get your results is to register for My Chart.   IF you received an x-ray today, you will receive an invoice from [facility]. Please contact [facility] at [phone] with questions or concerns regarding your invoice.   IF you received labwork today, you will receive an invoice from [facility]. Please contact [facility] at [phone] with questions or concerns regarding your invoice.   Our billing staff will not be able to assist you with questions regarding bills from these companies.  You will be contacted with the lab results as soon as they are available. The fastest way to get your results is to activate your My Chart account. Instructions are located on the last page of this paperwork. If you have not heard from [place] regarding the results in 2 weeks, please contact this office.       I personally performed the services described in this documentation, which was scribed in my presence. The recorded information has been reviewed and considered for accuracy and completeness, addended by me as needed, and agree with information above.  Signed,   Meredith Staggers, MD Primary Care at [facility].  08/27/18 10:35 PM

## 2018-08-29 NOTE — Progress Notes (Signed)
I called Ms Casey Brewer using an Interpreter from [employer], [place], [place] # [id number], a recording said it was no longer in service.  Interpreter called patients contact [name] and left a message for he to call me.  I have not received a call.

## 2018-09-01 ENCOUNTER — Encounter (HOSPITAL_COMMUNITY)
Admission: RE | Disposition: A | Discharge status: Home / Self Care | Payer: Self-pay | Source: Ambulatory Visit | Attending: Gastroenterology

## 2018-09-01 ENCOUNTER — Encounter (HOSPITAL_COMMUNITY): Payer: Self-pay | Admitting: *Deleted

## 2018-09-01 ENCOUNTER — Ambulatory Visit (HOSPITAL_COMMUNITY): Payer: BLUE CROSS/BLUE SHIELD | Admitting: Certified Registered Nurse Anesthetist

## 2018-09-01 ENCOUNTER — Ambulatory Visit (HOSPITAL_COMMUNITY)
Admission: RE | Admit: 2018-09-01 | Discharge: 2018-09-01 | Disposition: A | Discharge status: Home / Self Care | Payer: BLUE CROSS/BLUE SHIELD | Source: Ambulatory Visit | Attending: Gastroenterology | Admitting: Gastroenterology

## 2018-09-01 DIAGNOSIS — F329 Major depressive disorder, single episode, unspecified: Secondary | ICD-10-CM | POA: Insufficient documentation

## 2018-09-01 DIAGNOSIS — K449 Diaphragmatic hernia without obstruction or gangrene: Secondary | ICD-10-CM | POA: Diagnosis not present

## 2018-09-01 DIAGNOSIS — K8051 Calculus of bile duct without cholangitis or cholecystitis with obstruction: Secondary | ICD-10-CM | POA: Diagnosis not present

## 2018-09-01 DIAGNOSIS — Z888 Allergy status to other drugs, medicaments and biological substances status: Secondary | ICD-10-CM | POA: Diagnosis not present

## 2018-09-01 DIAGNOSIS — Z8249 Family history of ischemic heart disease and other diseases of the circulatory system: Secondary | ICD-10-CM | POA: Insufficient documentation

## 2018-09-01 DIAGNOSIS — K8050 Calculus of bile duct without cholangitis or cholecystitis without obstruction: Secondary | ICD-10-CM

## 2018-09-01 DIAGNOSIS — Z79899 Other long term (current) drug therapy: Secondary | ICD-10-CM | POA: Insufficient documentation

## 2018-09-01 DIAGNOSIS — K8020 Calculus of gallbladder without cholecystitis without obstruction: Secondary | ICD-10-CM

## 2018-09-01 DIAGNOSIS — K838 Other specified diseases of biliary tract: Secondary | ICD-10-CM | POA: Diagnosis not present

## 2018-09-01 HISTORY — PX: ESOPHAGOGASTRODUODENOSCOPY: SHX5428

## 2018-09-01 HISTORY — PX: EUS: SHX5427

## 2018-09-01 SURGERY — UPPER ENDOSCOPIC ULTRASOUND (EUS) LINEAR
Anesthesia: General

## 2018-09-01 MED ORDER — GLUCAGON HCL RDNA (DIAGNOSTIC) 1 MG IJ SOLR
INTRAMUSCULAR | Status: AC
  Filled 2018-09-01: qty 1

## 2018-09-01 MED ORDER — MIDAZOLAM HCL 5 MG/5ML IJ SOLN
INTRAMUSCULAR | Status: DC | PRN
  Administered 2018-09-01: 2 mg via INTRAVENOUS

## 2018-09-01 MED ORDER — CIPROFLOXACIN IN D5W 400 MG/200ML IV SOLN
INTRAVENOUS | Status: AC
  Filled 2018-09-01: qty 200

## 2018-09-01 MED ORDER — ROCURONIUM BROMIDE 10 MG/ML (PF) SYRINGE
PREFILLED_SYRINGE | INTRAVENOUS | Status: DC | PRN
  Administered 2018-09-01: 40 mg via INTRAVENOUS
  Administered 2018-09-01: 10 mg via INTRAVENOUS

## 2018-09-01 MED ORDER — FENTANYL CITRATE (PF) 100 MCG/2ML IJ SOLN
INTRAMUSCULAR | Status: DC | PRN
  Administered 2018-09-01: 50 ug via INTRAVENOUS

## 2018-09-01 MED ORDER — INDOMETHACIN 50 MG RE SUPP
RECTAL | Status: AC
  Filled 2018-09-01: qty 2

## 2018-09-01 MED ORDER — SODIUM CHLORIDE 0.9 % IV SOLN: INTRAVENOUS | Status: DC

## 2018-09-01 MED ORDER — SUGAMMADEX SODIUM 200 MG/2ML IV SOLN
INTRAVENOUS | Status: DC | PRN
  Administered 2018-09-01: 200 mg via INTRAVENOUS

## 2018-09-01 MED ORDER — PROPOFOL 10 MG/ML IV BOLUS
INTRAVENOUS | Status: DC | PRN
  Administered 2018-09-01: 150 mg via INTRAVENOUS

## 2018-09-01 MED ORDER — LIDOCAINE HCL (CARDIAC) PF 100 MG/5ML IV SOSY
PREFILLED_SYRINGE | INTRAVENOUS | Status: DC | PRN
  Administered 2018-09-01: 60 mg via INTRAVENOUS

## 2018-09-01 MED ORDER — IOPAMIDOL (ISOVUE-300) INJECTION 61%
INTRAVENOUS | Status: AC
  Filled 2018-09-01: qty 50

## 2018-09-01 MED ORDER — DEXAMETHASONE SODIUM PHOSPHATE 10 MG/ML IJ SOLN
INTRAMUSCULAR | Status: DC | PRN
  Administered 2018-09-01: 4 mg via INTRAVENOUS

## 2018-09-01 MED ORDER — LACTATED RINGERS IV SOLN
INTRAVENOUS | Status: DC | PRN
  Administered 2018-09-01: 10:00:00 via INTRAVENOUS

## 2018-09-01 NOTE — H&P (Signed)
GASTROENTEROLOGY OUTPATIENT PROCEDURE H&P NOTE   Primary Care Physician: Myles Lipps, MD  HPI: Casey Brewer is a 62 y.o. female who presents for ERCP and possible EUS to evaluate an abnormal MRI/MRCP and prior possible choledocholithiasis.  Past Medical History:  Diagnosis Date  . Complication of anesthesia    "air escaped from hole in my nose and was in my face"  . Depression    Past Surgical History:  Procedure Laterality Date  . ABDOMINAL HYSTERECTOMY    . BLADDER SURGERY    . CHOLECYSTECTOMY    . THROAT SURGERY     Current Facility-Administered Medications  Medication Dose Route Frequency Provider Last Rate Last Dose  . 0.9 %  sodium chloride infusion   Intravenous Continuous Mansouraty, Netty Starring., MD       Allergies  Allergen Reactions  . Dipyrone Palpitations   Family History  Problem Relation Age of Onset  . Heart disease Mother    Social History   Socioeconomic History  . Marital status: Married    Spouse name: Not on file  . Number of children: Not on file  . Years of education: Not on file  . Highest education level: Not on file  Occupational History  . Not on file  Social Needs  . Financial resource strain: Not on file  . Food insecurity:    Worry: Not on file    Inability: Not on file  . Transportation needs:    Medical: Not on file    Non-medical: Not on file  Tobacco Use  . Smoking status: Never Smoker  . Smokeless tobacco: Never Used  Substance and Sexual Activity  . Alcohol use: No    Alcohol/week: 0.0 standard drinks  . Drug use: Not Currently  . Sexual activity: Not on file  Lifestyle  . Physical activity:    Days per week: Not on file    Minutes per session: Not on file  . Stress: Not on file  Relationships  . Social connections:    Talks on phone: Not on file    Gets together: Not on file    Attends religious service: Not on file    Active member of club or organization: Not on file    Attends meetings of  clubs or organizations: Not on file    Relationship status: Not on file  . Intimate partner violence:    Fear of current or ex partner: Not on file    Emotionally abused: Not on file    Physically abused: Not on file    Forced sexual activity: Not on file  Other Topics Concern  . Not on file  Social History Narrative  . Not on file    Physical Exam: Vital signs in last 24 hours: Temp:  [98.5 F (36.9 C)] 98.5 F (36.9 C) (09/30 0952) Pulse Rate:  [76] 76 (09/30 0952) Resp:  [16] 16 (09/30 0952) BP: (135)/(77) 135/77 (09/30 0952) SpO2:  [97 %] 97 % (09/30 0952)   GEN: NAD EYE: Sclerae anicteric ENT: MMM CV: RR without R/Gs  RESP: CTAB posteriorly GI: Soft, NT/ND NEURO:  Alert & Oriented x 3  Lab Results: No results for input(s): WBC, HGB, HCT, PLT in the last 72 hours. BMET No results for input(s): NA, K, CL, CO2, GLUCOSE, BUN, CREATININE, CALCIUM in the last 72 hours. LFT No results for input(s): PROT, ALBUMIN, AST, ALT, ALKPHOS, BILITOT, BILIDIR, IBILI in the last 72 hours. PT/INR No results for input(s): LABPROT, INR  in the last 72 hours.   Impression / Plan: This is a 62 y.o.female who presents for ERCP and possible EUS.  The risks of EUS including bleeding, infection, aspiration pneumonia and intestinal perforation were discussed as was the possibility it may not give a definitive diagnosis.  If a biopsy of the pancreas is done as part of the EUS, there is an additional risk of pancreatitis at the rate of about 1%.  It was explained that procedure related pancreatitis is typically mild, although can be severe and even life threatening, which is why we do not perform random pancreatic biopsies and only biopsy a lesion we feel is concerning enough to warrant the risk.   The risks of an ERCP were discussed at length, including but not limited to the risk of perforation, bleeding, abdominal pain, post-ERCP pancreatitis (while usually mild can be severe and even life  threatening).   The risks and benefits of endoscopic evaluation were discussed with the patient; these include but are not limited to the risk of perforation, infection, bleeding, missed lesions, lack of diagnosis, severe illness requiring hospitalization, as well as anesthesia and sedation related illnesses.  [name] are agreeable to proceed.    Corliss Parish, MD [facility] Office # [phone]

## 2018-09-01 NOTE — Progress Notes (Signed)
Interpretor used for admitting patient and for physician conversation.

## 2018-09-01 NOTE — Anesthesia Procedure Notes (Signed)
Procedure Name: Intubation Date/Time: 09/01/2018 10:23 AM Performed by: Carney Living, CRNA Pre-anesthesia Checklist: Patient identified, Emergency Drugs available, Suction available, Patient being monitored and Timeout performed Patient Re-evaluated:Patient Re-evaluated prior to induction Oxygen Delivery Method: Circle system utilized Preoxygenation: Pre-oxygenation with 100% oxygen Induction Type: IV induction Ventilation: Mask ventilation without difficulty Laryngoscope Size: Mac and 4 Grade View: Grade I Tube type: Oral Tube size: 7.0 mm Number of attempts: 1 Airway Equipment and Method: Stylet Placement Confirmation: ETT inserted through vocal cords under direct vision,  positive ETCO2 and breath sounds checked- equal and bilateral Secured at: 22 cm Tube secured with: Tape Dental Injury: Teeth and Oropharynx as per pre-operative assessment

## 2018-09-01 NOTE — Transfer of Care (Signed)
Immediate Anesthesia Transfer of Care Note  Patient: Casey Brewer  Procedure(s) Performed: UPPER ENDOSCOPIC ULTRASOUND (EUS) LINEAR (N/A )  Patient Location: Endoscopy Unit  Anesthesia Type:General  Level of Consciousness: drowsy and patient cooperative  Airway & Oxygen Therapy: Patient Spontanous Breathing and Patient connected to nasal cannula oxygen  Post-op Assessment: Report given to RN, Post -op Vital signs reviewed and stable and Patient moving all extremities X 4  Post vital signs: Reviewed and stable  Last Vitals:  Vitals Value Taken Time  BP 118/56 09/01/2018 11:50 AM  Temp 36.7 C 09/01/2018 11:50 AM  Pulse 87 09/01/2018 11:50 AM  Resp 23 09/01/2018 11:50 AM  SpO2 98 % 09/01/2018 11:50 AM  Vitals shown include unvalidated device data.  Last Pain:  Vitals:   09/01/18 1150  TempSrc: Oral  PainSc: 0-No pain         Complications: No apparent anesthesia complications

## 2018-09-01 NOTE — Op Note (Signed)
[facility] Patient Name: Casey Brewer Procedure Date : 09/01/2018 MRN: [id number] Attending MD: Justice Britain , MD Date of Birth: 1956-06-15 CSN: [id number] Age: 62 Admit Type: Outpatient Procedure:                Upper EUS - LImited Examination Indications:              Common bile duct dilation (acquired) seen on MRCP,                            Choledocholithiasis on MRCP, Obstruction of bile                            duct on MRCP, Prior failed ERCP biliary cannulation Providers:                Justice Britain, MD, Cleda Daub, RN, Cherylynn Ridges, Technician Referring MD:             Milus Banister, MD, Lilia Argue. [place] Medicines:                General Anesthesia Complications:            No immediate complications. Estimated Blood Loss:     Estimated blood loss was minimal. Procedure:                Pre-Anesthesia Assessment:                           - Prior to the procedure, a History and Physical                            was performed, and patient medications and                            allergies were reviewed. The patient's tolerance of                            previous anesthesia was also reviewed. The risks                            and benefits of the procedure and the sedation                            options and risks were discussed with the patient.                            All questions were answered, and informed consent                            was obtained. Prior Anticoagulants: The patient has                            taken no previous anticoagulant or antiplatelet  agents. ASA Grade Assessment: II - A patient with                            mild systemic disease. After reviewing the risks                            and benefits, the patient was deemed in                            satisfactory condition to undergo the procedure.                           After  obtaining informed consent, the endoscope was                            passed under direct vision. Throughout the                            procedure, the patient's blood pressure, pulse, and                            oxygen saturations were monitored continuously. The                            [id number]-Q180V ([id number]) Olympus ERCP was introduced                            through the mouth, and advanced to the                            cricopharyngeal esophagus. There was some pressure                            with trying to pass the scope and with prior                            history, although recent EGD without overt                            diverticulum noted, decision was made to replace                            with an adult endoscope. The [id number]-H190 ([id number])                            Olympus adult EGD was introduced through the mouth,                            and advanced to the third part of duodenum. To                            begin EUS examination, the [id number] ([id number])  Olympus Linear EUS was introduced through the                            mouth, and advanced to the duodenum for ultrasound                            examination. The upper EUS was technically                            difficult and complex due to anatomy requiring                            passage of EUS scope into D2/D3 to allow adequate                            visualization of the head of the pancreas. The                            patient tolerated the procedure. Due to an issues                            with capture capability, EUS Imaging is not on                            provation but has been taken and uploaded through                            [facility] into the [place] [facility]. Scope In: Scope Out: Findings:      ENDOSCOPIC FINDING: :      Initial attempt at placement of the duodenscope met resistance at the       UES. Although recent EGD had not  visualized a diverticulum, decision was       made to replace with an EGD adult scope. At the UES, is a very small       pocket on the right-side; suspect the possibility of developing a       traction diverticulum. To allow adequate passage of the other scopes       moving forward, placement of a short 0.025 inch Antonietta Breach was attempted.       This passed successfully into the stomach.      No other gross lesions were noted in the entire esophagus.      A medium-sized hiatal hernia was present in the stomach.      No gross lesions were noted in the entire examined duodenum.      ENDOSONOGRAPHIC FINDING: :      There was no sign of significant endosonographic abnormality in the       common bile duct (5.6 mm -> 4.1 mm) and in the common hepatic duct (6.3       mm). No calcifications, no stones, no biliary sludge and ducts with       regular contour were identified to the ampulla.      There was no sign of significant endosonographic abnormality in the       pancreatic head. The pancreatic duct measured up to 1.4 mm in diameter.  No masses, no calcifications, the pancreatic duct was thin in caliber to       the ampulla.      Endosonographic imaging of the ampulla showed no intramural lesions. Impression:               EGD Impression:                           - Potential developing traction diverticulum at the                            right side of the UES.                           - No other gross lesions in esophagus.                           - Medium-sized hiatal hernia.                           - No gross lesions in the entire examined duodenum.                           EUS Impression:                           - There was no sign of significant pathology in the                            common bile duct and in the common hepatic duct. No                            evidence of stone or sludge on exam under EUS                           - There was no sign of significant  pathology in the                            pancreatic head with a thin bile duct.                           - The CBD and PD were evaluated from the CHD into                            the ampulla without evidence of significant filling                            defect or stenosis being present.                           - Decision made to hold on repeat attempt at ERCP. Recommendation:           - The patient will be observed post-procedure,  until all discharge criteria are met.                           - Discharge patient to home.                           - Check liver enzymes (AST, ALT, alkaline                            phosphatase, bilirubin) today.                           - Observe patient's clinical course.                           - Return to GI clinic as previously scheduled.                           - Continue to watch diet in regards to spicy and                            fatty foods.                           - If the patient has recurrence of abdominal pain                            with associated elevation in LFTs, then can proceed                            with a repeat attempt at ERCP; however in last                            58-month she has not had recrudescence of her pain                            while monitoring her diet. With today's                            examination, from the CHD to the ampulla, there was                            not evidence of a filling defect or significant                            sludge present. Would hold on repeating ERCP                            (increased difficulty procedure already with prior                            failed attempt) unless clinical changes occur in  regards to her pain/discomfort with association of                            liver biochemical testing abnormalities.                           - The findings and recommendations were discussed                             with the patient.                           - The findings and recommendations were discussed                            with the patient's family.                           - The findings and recommendations were discussed                            with the referring physician. Procedure Code(s):        --- Professional ---                           [phone], Esophagogastroduodenoscopy, flexible,                            transoral; with endoscopic ultrasound examination                            limited to the esophagus, stomach or duodenum, and                            adjacent structures Diagnosis Code(s):        --- Professional ---                           Q39.6, Congenital diverticulum of esophagus                           K44.9, Diaphragmatic hernia without obstruction or                            gangrene                           K80.50, Calculus of bile duct without cholangitis                            or cholecystitis without obstruction                           K83.1, Obstruction of bile duct                           K83.8, Other specified diseases of biliary tract CPT copyright  2017 American Medical Association. All rights reserved. The codes documented in this report are preliminary and upon coder review may  be revised to meet current compliance requirements. Justice Britain, MD 09/01/2018 12:03:50 PM Number of Addenda: 0

## 2018-09-01 NOTE — Anesthesia Preprocedure Evaluation (Signed)
Anesthesia Evaluation  Patient identified by MRN, date of birth, ID band Patient awake    Reviewed: Allergy & Precautions, H&P , NPO status , Patient's Chart, lab work & pertinent test results  Airway Mallampati: II   Neck ROM: full    Dental   Pulmonary neg pulmonary ROS,    breath sounds clear to auscultation       Cardiovascular negative cardio ROS   Rhythm:regular Rate:Normal     Neuro/Psych PSYCHIATRIC DISORDERS Depression    GI/Hepatic   Endo/Other    Renal/GU      Musculoskeletal   Abdominal   Peds  Hematology   Anesthesia Other Findings   Reproductive/Obstetrics                             Anesthesia Physical Anesthesia Plan  ASA: II  Anesthesia Plan: General   Post-op Pain Management:    Induction: Intravenous  PONV Risk Score and Plan: 3 and Ondansetron, Dexamethasone, Midazolam and Treatment may vary due to age or medical condition  Airway Management Planned: Oral ETT  Additional Equipment:   Intra-op Plan:   Post-operative Plan: Extubation in OR  Informed Consent: I have reviewed the patients History and Physical, chart, labs and discussed the procedure including the risks, benefits and alternatives for the proposed anesthesia with the patient or authorized representative who has indicated his/her understanding and acceptance.     Plan Discussed with: CRNA, Anesthesiologist and Surgeon  Anesthesia Plan Comments:         Anesthesia Quick Evaluation

## 2018-09-02 ENCOUNTER — Encounter (HOSPITAL_COMMUNITY): Payer: Self-pay | Admitting: Gastroenterology

## 2018-09-02 NOTE — Anesthesia Postprocedure Evaluation (Signed)
Anesthesia Post Note  Patient: Casey Brewer  Procedure(s) Performed: UPPER ENDOSCOPIC ULTRASOUND (EUS) LINEAR (N/A ) ESOPHAGOGASTRODUODENOSCOPY (EGD) (N/A )     Patient location during evaluation: PACU Anesthesia Type: General Level of consciousness: awake and alert Pain management: pain level controlled Vital Signs Assessment: post-procedure vital signs reviewed and stable Respiratory status: spontaneous breathing, nonlabored ventilation, respiratory function stable and patient connected to nasal cannula oxygen Cardiovascular status: blood pressure returned to baseline and stable Postop Assessment: no apparent nausea or vomiting Anesthetic complications: no    Last Vitals:  Vitals:   09/01/18 1210 09/01/18 1220  BP: 117/62 110/86  Pulse: 74 77  Resp: 16 18  Temp:    SpO2: 97% 96%    Last Pain:  Vitals:   09/01/18 1220  TempSrc:   PainSc: 0-No pain                 Bibiana Gillean S

## 2018-09-09 ENCOUNTER — Other Ambulatory Visit: Payer: Self-pay

## 2018-09-09 ENCOUNTER — Ambulatory Visit (INDEPENDENT_AMBULATORY_CARE_PROVIDER_SITE_OTHER): Payer: BLUE CROSS/BLUE SHIELD | Admitting: Family Medicine

## 2018-09-09 ENCOUNTER — Encounter: Payer: Self-pay | Admitting: Family Medicine

## 2018-09-09 VITALS — BP 112/80 | HR 66 | Temp 98.0°F | Resp 16 | Ht 60.5 in | Wt 154.2 lb

## 2018-09-09 DIAGNOSIS — A6000 Herpesviral infection of urogenital system, unspecified: Secondary | ICD-10-CM

## 2018-09-09 MED ORDER — VALACYCLOVIR HCL 1 G PO TABS: 1000.0000 mg | ORAL_TABLET | Freq: Every day | ORAL | 3 refills | Status: DC

## 2018-09-09 NOTE — Progress Notes (Signed)
   10/8/201910:29 AM  Casey Brewer [birth year], 62 y.o. female [id number]  Chief Complaint  Patient presents with  . follow up    Shingles, per interpreter pt is a little better. I still feel burning and itching in the vaginal and rectal area.    HPI:   Patient is a 62 y.o. female with past medical history significant for genital herpes who presents today for followup  Last seen on 8/20/219 - treated with valacyclovir for first time Overall better  She has noticed similar tingling sensation in her vagina for past 2 days and thinks she is getting another outbreak Denies any lesions  Fall Risk  09/09/2018 08/26/2018 07/22/2018 06/28/2018 05/21/2018  Falls in the past year? No No No No No     Depression screen [facility] 2/9 09/09/2018 08/26/2018 07/22/2018  Decreased Interest 0 0 0  Down, Depressed, Hopeless 0 0 0  PHQ - 2 Score 0 0 0    Allergies  Allergen Reactions  . Dipyrone Palpitations    Prior to Admission medications   Medication Sig Start Date End Date Taking? Authorizing Provider  omeprazole (PRILOSEC) 40 MG capsule Take 40 mg by mouth daily.   Yes [provider]  UNABLE TO FIND Take 0.5 tablets by mouth daily. Med Name: Adepsique (Formula: Amitriptilina 10mg [place] 3mg Iverson Alamin 2 mg)   Yes [provider]    Past Medical History:  Diagnosis Date  . Complication of anesthesia    "air escaped from hole in my nose and was in my face"  . Depression     Past Surgical History:  Procedure Laterality Date  . ABDOMINAL HYSTERECTOMY    . BLADDER SURGERY    . CHOLECYSTECTOMY    . ESOPHAGOGASTRODUODENOSCOPY N/A 09/01/2018   Procedure: ESOPHAGOGASTRODUODENOSCOPY (EGD);  Surgeon: Lemar Lofty., MD;  Location: [facility] ENDOSCOPY;  Service: Gastroenterology;  Laterality: N/A;  . EUS N/A 09/01/2018   Procedure: UPPER ENDOSCOPIC ULTRASOUND (EUS) LINEAR;  Surgeon: Lemar Lofty., MD;  Location: [facility] ENDOSCOPY;  Service: Gastroenterology;   Laterality: N/A;  . THROAT SURGERY      Social History   Tobacco Use  . Smoking status: Never Smoker  . Smokeless tobacco: Never Used  Substance Use Topics  . Alcohol use: No    Alcohol/week: 0.0 standard drinks    Family History  Problem Relation Age of Onset  . Heart disease Mother     ROS Per hpi  OBJECTIVE:  Blood pressure 112/80, pulse 66, temperature 98 F (36.7 C), temperature source Oral, resp. rate 16, height 5' 0.5" (1.537 m), weight 154 lb 3.2 oz (69.9 kg), SpO2 98 %. Body mass index is 29.62 kg/m.   Physical Exam  GEN: AAOx3, NAD Skin: no lesions noted in buttocks or vulva   ASSESSMENT and PLAN  1. Genital herpes simplex, unspecified site Treating for potential second outbreak. Discussed possible suppressive therapy. Patient planning on going back to [place] for several months to visit family Other orders - valACYclovir (VALTREX) 1000 MG tablet; Take 1 tablet (1,000 mg total) by mouth daily.  Return if symptoms worsen or fail to improve.    Myles Lipps, MD Primary Care at [facility] [address] [place], [place] [address] Ph.  [phone] Fax [phone]

## 2018-09-09 NOTE — Patient Instructions (Signed)
° ° ° °  If you have lab work done today you will be contacted with your lab results within the next 2 weeks.  If you have not heard from us then please contact us. The fastest way to get your results is to register for My Chart. ° ° °IF you received an x-ray today, you will receive an invoice from [facility]. Please contact  Radiology at [phone] with questions or concerns regarding your invoice.  ° °IF you received labwork today, you will receive an invoice from [facility]. Please contact [facility] at [phone] with questions or concerns regarding your invoice.  ° °Our billing staff will not be able to assist you with questions regarding bills from these companies. ° °You will be contacted with the lab results as soon as they are available. The fastest way to get your results is to activate your My Chart account. Instructions are located on the last page of this paperwork. If you have not heard from us regarding the results in 2 weeks, please contact this office. °  ° ° ° °

## 2018-10-07 ENCOUNTER — Encounter (HOSPITAL_COMMUNITY): Payer: BLUE CROSS/BLUE SHIELD

## 2018-10-07 ENCOUNTER — Encounter: Payer: BLUE CROSS/BLUE SHIELD | Admitting: Vascular Surgery

## 2019-04-22 ENCOUNTER — Ambulatory Visit (HOSPITAL_COMMUNITY)
Admission: EM | Admit: 2019-04-22 | Discharge: 2019-04-22 | Disposition: A | Discharge status: Home / Self Care | Payer: BLUE CROSS/BLUE SHIELD | Attending: Family Medicine | Admitting: Family Medicine

## 2019-04-22 ENCOUNTER — Other Ambulatory Visit: Payer: Self-pay

## 2019-04-22 ENCOUNTER — Encounter (HOSPITAL_COMMUNITY): Payer: Self-pay

## 2019-04-22 ENCOUNTER — Telehealth: Payer: Self-pay | Admitting: *Deleted

## 2019-04-22 DIAGNOSIS — J069 Acute upper respiratory infection, unspecified: Secondary | ICD-10-CM

## 2019-04-22 DIAGNOSIS — Z20822 Contact with and (suspected) exposure to covid-19: Secondary | ICD-10-CM

## 2019-04-22 DIAGNOSIS — B9789 Other viral agents as the cause of diseases classified elsewhere: Secondary | ICD-10-CM

## 2019-04-22 MED ORDER — BENZONATATE 200 MG PO CAPS: 200.0000 mg | ORAL_CAPSULE | Freq: Three times a day (TID) | ORAL | 0 refills | Status: AC | PRN

## 2019-04-22 NOTE — ED Triage Notes (Signed)
Patient presents to Urgent Care with complaints of sore throat and slight cough since a few days ago. Patient reports she has not been taking meds for her sx, afebrile during triage.

## 2019-04-22 NOTE — Telephone Encounter (Signed)
Pt scheduled for testing at [facility] 5/21/202 per Dahlia Byes, NP request. Pts daughter also scheduled at that time. Daughter translating for pt, verbalizes understanding of testing process. Pt with fever, sore throat, cough

## 2019-04-22 NOTE — ED Provider Notes (Signed)
[facility]    CSN: [id number] Arrival date & time: 04/22/19  1335     History   Chief Complaint Chief Complaint  Patient presents with  . Sore Throat  . Cough    HPI Casey Brewer is a 63 y.o. female no contributing past medical history presenting today for evaluation of sore throat and cough.  Symptoms began yesterday.  She denies any fevers.  She has not taken any medicines for her symptoms.  She is here with her daughter and husband who likely have COVID.  No known exposure to COVID.  Denies rhinorrhea.  Denies shortness of breath or chest pain.  HPI  Past Medical History:  Diagnosis Date  . Complication of anesthesia    "air escaped from hole in my nose and was in my face"  . Depression     Patient Active Problem List   Diagnosis Date Noted  . Bile duct stone     Past Surgical History:  Procedure Laterality Date  . ABDOMINAL HYSTERECTOMY    . BLADDER SURGERY    . CHOLECYSTECTOMY    . ESOPHAGOGASTRODUODENOSCOPY N/A 09/01/2018   Procedure: ESOPHAGOGASTRODUODENOSCOPY (EGD);  Surgeon: Lemar Lofty., MD;  Location: [facility] ENDOSCOPY;  Service: Gastroenterology;  Laterality: N/A;  . EUS N/A 09/01/2018   Procedure: UPPER ENDOSCOPIC ULTRASOUND (EUS) LINEAR;  Surgeon: Lemar Lofty., MD;  Location: [facility] ENDOSCOPY;  Service: Gastroenterology;  Laterality: N/A;  . THROAT SURGERY      OB History   No obstetric history on file.      Home Medications    Prior to Admission medications   Medication Sig Start Date End Date Taking? Authorizing Provider  benzonatate (TESSALON) 200 MG capsule Take 1 capsule (200 mg total) by mouth 3 (three) times daily as needed for up to 7 days for cough. 04/22/19 04/29/19  Ariahna Smiddy C, PA-C  omeprazole (PRILOSEC) 40 MG capsule Take 40 mg by mouth daily.    [provider]  UNABLE TO FIND Take 0.5 tablets by mouth daily. Med Name: Adepsique (Formula: Amitriptilina [place] Iverson Alamin 2 mg)     [provider]  valACYclovir (VALTREX) 1000 MG tablet Take 1 tablet (1,000 mg total) by mouth daily. 09/09/18   Myles Lipps, MD    Family History Family History  Problem Relation Age of Onset  . Heart disease Mother   . Healthy Father     Social History Social History   Tobacco Use  . Smoking status: Never Smoker  . Smokeless tobacco: Never Used  Substance Use Topics  . Alcohol use: No    Alcohol/week: 0.0 standard drinks  . Drug use: Not Currently     Allergies   Dipyrone   Review of Systems Review of Systems  Constitutional: Negative for activity change, appetite change, chills, fatigue and fever.  HENT: Positive for sore throat. Negative for congestion, ear pain, rhinorrhea, sinus pressure and trouble swallowing.   Eyes: Negative for discharge and redness.  Respiratory: Positive for cough. Negative for chest tightness and shortness of breath.   Cardiovascular: Negative for chest pain.  Gastrointestinal: Negative for abdominal pain, diarrhea, nausea and vomiting.  Musculoskeletal: Negative for myalgias.  Skin: Negative for rash.  Neurological: Negative for dizziness, light-headedness and headaches.     Physical Exam Triage Vital Signs ED Triage Vitals  Enc Vitals Group     BP 04/22/19 1414 120/74     Pulse Rate 04/22/19 1414 65     Resp 04/22/19 1414 18  Temp 04/22/19 1414 98.9 F (37.2 C)     Temp Source 04/22/19 1414 Oral     SpO2 04/22/19 1414 98 %     Weight --      Height --      Head Circumference --      Peak Flow --      Pain Score 04/22/19 1412 4     Pain Loc --      Pain Edu? --      Excl. in GC? --    No data found.  Updated Vital Signs BP 120/74 (BP Location: Left Arm)   Pulse 65   Temp 98.9 F (37.2 C) (Oral)   Resp 18   SpO2 98%   Visual Acuity Right Eye Distance:   Left Eye Distance:   Bilateral Distance:    Right Eye Near:   Left Eye Near:    Bilateral Near:     Physical Exam Vitals signs and  nursing note reviewed.  Constitutional:      General: She is not in acute distress.    Appearance: She is well-developed.  HENT:     Head: Normocephalic and atraumatic.     Ears:     Comments: Bilateral ears without tenderness to palpation of external auricle, tragus and mastoid, EAC's without erythema or swelling, TM's with good bony landmarks and cone of light. Non erythematous.    Mouth/Throat:     Comments: Oral mucosa pink and moist, no tonsillar enlargement or exudate. Posterior pharynx patent and nonerythematous, no uvula deviation or swelling. Normal phonation. Eyes:     Conjunctiva/sclera: Conjunctivae normal.  Neck:     Musculoskeletal: Neck supple.  Cardiovascular:     Rate and Rhythm: Normal rate and regular rhythm.     Heart sounds: No murmur.  Pulmonary:     Effort: Pulmonary effort is normal. No respiratory distress.     Breath sounds: Normal breath sounds.     Comments: Breathing comfortably at rest, CTABL, no wheezing, rales or other adventitious sounds auscultated Abdominal:     Palpations: Abdomen is soft.     Tenderness: There is no abdominal tenderness.  Skin:    General: Skin is warm and dry.  Neurological:     Mental Status: She is alert.      UC Treatments / Results  Labs (all labs ordered are listed, but only abnormal results are displayed) Labs Reviewed - No data to display  EKG None  Radiology No results found.  Procedures Procedures (including critical care time)  Medications Ordered in UC Medications - No data to display  Initial Impression / Assessment and Plan / UC Course  I have reviewed the triage vital signs and the nursing notes.  Pertinent labs & imaging results that were available during my care of the patient were reviewed by me and considered in my medical decision making (see chart for details).     Symptoms most likely viral, likely COVID.  Will recommend symptomatic and supportive care.  Vital signs stable without  fever, tachycardia or hypoxia.  Rest, Tylenol, Tessalon as needed.  Push fluids.  COVID testing tomorrow morning through [facility].Discussed strict return precautions. Patient verbalized understanding and is agreeable with plan.  Final Clinical Impressions(s) / UC Diagnoses   Final diagnoses:  Viral URI with cough     Discharge Instructions     Your symptoms are most likely from COVID Please rest and drink plenty of fluids Tylenol for pain and fever Tessalon as needed every  8 hours for cough May use Mucinex or over-the-counter cetirizine/Zyrtec to help with congestion/throat irritation Honey and lemon tea for cough and sore throat  Please follow-up if symptoms worsening, developing shortness of breath, fevers, weakness, lightheadedness, worsening cough    ED Prescriptions    Medication Sig Dispense Auth. Provider   benzonatate (TESSALON) 200 MG capsule Take 1 capsule (200 mg total) by mouth 3 (three) times daily as needed for up to 7 days for cough. 28 capsule Aiana Nordquist C, PA-C     Controlled Substance Prescriptions [place] Controlled Substance Registry consulted? Not Applicable   Lew DawesWieters, [place] C, [place] JerseyPA-C 04/22/19 1601

## 2019-04-22 NOTE — Discharge Instructions (Addendum)
Your symptoms are most likely from COVID Please rest and drink plenty of fluids Tylenol for pain and fever Tessalon as needed every 8 hours for cough May use Mucinex or over-the-counter cetirizine/Zyrtec to help with congestion/throat irritation Honey and lemon tea for cough and sore throat  Please follow-up if symptoms worsening, developing shortness of breath, fevers, weakness, lightheadedness, worsening cough

## 2019-04-23 ENCOUNTER — Other Ambulatory Visit: Payer: BLUE CROSS/BLUE SHIELD

## 2019-04-23 DIAGNOSIS — Z20822 Contact with and (suspected) exposure to covid-19: Secondary | ICD-10-CM

## 2019-04-23 DIAGNOSIS — R6889 Other general symptoms and signs: Secondary | ICD-10-CM | POA: Diagnosis not present

## 2019-04-26 LAB — NOVEL CORONAVIRUS, NAA: SARS-CoV-2, NAA: DETECTED — AB

## 2019-04-28 ENCOUNTER — Telehealth (HOSPITAL_COMMUNITY): Payer: Self-pay | Admitting: Emergency Medicine

## 2019-04-28 NOTE — Telephone Encounter (Signed)
Your test for COVID-19 was positive, meaning that you were infected with the novel coronavirus and could give the germ to others.  Please continue isolation at home, for at least 10 days since the start of your fever/cough/breathlessness and until you have had 3 consecutive days without fever (without taking a fever reducer) and with cough/breathlessness improving. Please continue good preventive care measures, including:  frequent hand-washing, avoid touching your face, cover coughs/sneezes, stay out of crowds and keep a 6 foot distance from others.  Recheck or go to the nearest hospital ED tent for re-assessment if fever/cough/breathlessness return.  Patient contacted and made aware of all results, all questions answered.   

## 2019-05-11 ENCOUNTER — Encounter (HOSPITAL_COMMUNITY): Payer: Self-pay | Admitting: Emergency Medicine

## 2019-05-11 ENCOUNTER — Other Ambulatory Visit: Payer: Self-pay

## 2019-05-11 ENCOUNTER — Ambulatory Visit (HOSPITAL_COMMUNITY)
Admission: EM | Admit: 2019-05-11 | Discharge: 2019-05-11 | Disposition: A | Discharge status: Home / Self Care | Payer: BLUE CROSS/BLUE SHIELD | Attending: Family Medicine | Admitting: Family Medicine

## 2019-05-11 DIAGNOSIS — U071 COVID-19: Secondary | ICD-10-CM

## 2019-05-11 DIAGNOSIS — F411 Generalized anxiety disorder: Secondary | ICD-10-CM

## 2019-05-11 MED ORDER — HYDROXYZINE HCL 25 MG PO TABS: 25.0000 mg | ORAL_TABLET | ORAL | 0 refills | Status: DC | PRN

## 2019-05-11 NOTE — Discharge Instructions (Addendum)
Hydroxyzine - take one for anxiety.  Take 2 at bedtime if needed Call your PCP tomorrow  PLEASE call before visiting here or a doctor visit and [place]

## 2019-05-11 NOTE — ED Triage Notes (Addendum)
Patient is nervous/anxious.  Husband is in hospital with covid  Patient is positive for covid.  Patient has no appetite.  Patient is unable to sleep   Patient is wanting another covid test-told patient that is not performed here Patient wanting to be seen for anxiety Patient has a headache  Patient has been on medicine from [place] for anxiety-ran out of medicine 5 days ago

## 2019-05-11 NOTE — ED Provider Notes (Signed)
[facility]    CSN: [id number] Arrival date & time: 05/11/19  1416     History   Chief Complaint Chief Complaint  Patient presents with  . Appointment    14:50  . Anxiety    HPI Casey Brewer is a 63 y.o. female.   HPI  Seen with the assistance of a Spanish interpreter Patient was seen at this office with her husband and daughter, all 3 with COVID-19 symptoms.  All 3 with positive testing.  Visit was 04/22/2019. Patient states that her husband was hospitalized it is still in the intensive care unit She states that this is been a very stressful situation for her and that she is been very anxious.  She is from [place].  She brings with her some medication from [place] that was previously prescribed for anxiety.  She is out of this medication.  She is here requesting a refill.  The medicine is one that is not available in our states, is a combination of amitriptyline 10 mg, diazepam 3 mg, and perphenazine 2 mg.  She is nervous all the time.  She is having trouble sleeping. No thoughts of harming self. Regarding the COVID-19 she thinks that she is over the symptoms.  She still has some headaches, she still has some neck stiffness and pain.  She thinks this is from stress.  No fever chills.  No cough or shortness of breath.  No sore throat  Past Medical History:  Diagnosis Date  . Complication of anesthesia    "air escaped from hole in my nose and was in my face"  . Depression     Patient Active Problem List   Diagnosis Date Noted  . Bile duct stone     Past Surgical History:  Procedure Laterality Date  . ABDOMINAL HYSTERECTOMY    . BLADDER SURGERY    . CHOLECYSTECTOMY    . ESOPHAGOGASTRODUODENOSCOPY N/A 09/01/2018   Procedure: ESOPHAGOGASTRODUODENOSCOPY (EGD);  Surgeon: Lemar LoftyMansouraty, Gabriel Jr., MD;  [facility];  Service: Gastroenterology;  Laterality: N/A;  . EUS N/A 09/01/2018   Procedure: UPPER ENDOSCOPIC ULTRASOUND (EUS) LINEAR;  Surgeon:  Lemar LoftyMansouraty, Gabriel Jr., MD;  [facility];  Service: Gastroenterology;  Laterality: N/A;  . THROAT SURGERY      OB History   No obstetric history on file.      Home Medications    Prior to Admission medications   Medication Sig Start Date End Date Taking? Authorizing Provider  valACYclovir (VALTREX) 1000 MG tablet Take 1 tablet (1,000 mg total) by mouth daily. 09/09/18  Yes Myles LippsSantiago, Irma M, MD  hydrOXYzine (ATARAX/VISTARIL) 25 MG tablet Take 1-2 tablets (25-50 mg total) by mouth every 4 (four) hours as needed. 05/11/19   Eustace MooreNelson, Basheer Molchan Sue, MD  UNABLE TO FIND Take 0.5 tablets by mouth daily. Med Name: Adepsique (Formula: Amitriptilina 10mg [place] Bing/Diazepam 3mg Iverson Alamin/Perfenazina 2 mg)    [provider]    Family History Family History  Problem Relation Age of Onset  . Heart disease Mother   . Healthy Father     Social History Social History   Tobacco Use  . Smoking status: Never Smoker  . Smokeless tobacco: Never Used  Substance Use Topics  . Alcohol use: No    Alcohol/week: 0.0 standard drinks  . Drug use: Not Currently     Allergies   Dipyrone   Review of Systems Review of Systems  Constitutional: Negative for chills and fever.  HENT: Negative for ear pain and sore throat.  Eyes: Negative for pain and visual disturbance.  Respiratory: Negative for cough and shortness of breath.   Cardiovascular: Negative for chest pain and palpitations.  Gastrointestinal: Negative for abdominal pain and vomiting.  Genitourinary: Negative for dysuria and hematuria.  Musculoskeletal: Positive for neck stiffness. Negative for arthralgias and back pain.  Skin: Negative for color change and rash.  Neurological: Positive for headaches. Negative for seizures and syncope.  Psychiatric/Behavioral: Positive for sleep disturbance. The patient is nervous/anxious.   All other systems reviewed and are negative.    Physical Exam Triage Vital Signs ED Triage Vitals  Enc Vitals  Group     BP 05/11/19 1449 104/71     Pulse Rate 05/11/19 1449 97     Resp 05/11/19 1449 18     Temp 05/11/19 1449 99.2 F (37.3 C)     Temp Source 05/11/19 1449 Oral     SpO2 05/11/19 1449 99 %     Weight --      Height --      Head Circumference --      Peak Flow --      Pain Score 05/11/19 1444 4     Pain Loc --      Pain Edu? --      Excl. in GC? --    No data found.  Updated Vital Signs BP 104/71 (BP Location: Left Arm)   Pulse 97   Temp 99.2 F (37.3 C) (Oral)   Resp 18   SpO2 99%   Visual Acuity Right Eye Distance:   Left Eye Distance:   Bilateral Distance:    Right Eye Near:   Left Eye Near:    Bilateral Near:     Physical Exam Constitutional:      General: She is not in acute distress.    Appearance: She is well-developed. She is not ill-appearing.  HENT:     Head: Normocephalic and atraumatic.  Eyes:     Conjunctiva/sclera: Conjunctivae normal.     Pupils: Pupils are equal, round, and reactive to light.  Neck:     Musculoskeletal: Normal range of motion.  Cardiovascular:     Rate and Rhythm: Normal rate.  Pulmonary:     Effort: Pulmonary effort is normal. No respiratory distress.  Abdominal:     General: There is no distension.     Palpations: Abdomen is soft.  Musculoskeletal: Normal range of motion.  Skin:    General: Skin is warm and dry.  Neurological:     Mental Status: She is alert.  Psychiatric:     Comments: Moderately anxious      UC Treatments / Results  Labs (all labs ordered are listed, but only abnormal results are displayed) Labs Reviewed - No data to display  EKG None  Radiology No results found.  Procedures Procedures (including critical care time)  Medications Ordered in UC Medications - No data to display  Initial Impression / Assessment and Plan / UC Course  I have reviewed the triage vital signs and the nursing notes.  Pertinent labs & imaging results that were available during my care of the patient  were reviewed by me and considered in my medical decision making (see chart for details).    I explained to the patient that I would not be refilling the medication she got in [place].  I will give her hydroxyzine to take to help with her symptoms.  I Explained to the patient the importance of calling her primary care doctor for  ongoing medical problems like anxiety.  I encouraged her to call her doctor tomorrow I also discussed with the patient the importance of calling before she comes to the office with her positive COVID status.  She states that she did have her granddaughter call to get her appointment today for anxiety, but the granddaughter failed to mention that she was positive for COVID within the last couple of weeks. I expressed empathy and sympathy regarding the amount of stress that she is going through and wished her family well Final Clinical Impressions(s) / UC Diagnoses   Final diagnoses:  Anxiety state  2019 novel coronavirus disease (COVID-19)     Discharge Instructions     Hydroxyzine - take one for anxiety.  Take 2 at bedtime if needed Call your PCP tomorrow  PLEASE call before visiting here or a doctor visit and [place]     ED Prescriptions    Medication Sig Dispense Auth. Provider   hydrOXYzine (ATARAX/VISTARIL) 25 MG tablet Take 1-2 tablets (25-50 mg total) by mouth every 4 (four) hours as needed. 20 tablet Raylene Everts, MD     Controlled Substance Prescriptions [place] Controlled Substance Registry consulted? Not Applicable   Raylene Everts, MD 05/11/19 2121

## 2019-05-27 IMAGING — MR MR 3D RECON AT SCANNER
20 of 22 series · 20 of 22 positions shown · IV contrast (multihance)
Comparison: None.

CLINICAL DATA: Choledocholithiasis.

EXAM:
MRI ABDOMEN WITHOUT AND WITH CONTRAST (INCLUDING MRCP)
TECHNIQUE: Multiplanar multisequence MR imaging of the abdomen was performed
both before and after the administration of intravenous contrast.
Heavily T2-weighted images of the biliary and pancreatic ducts were
obtained, and three-dimensional MRCP images were rendered by post
processing.
CONTRAST:  14mL MULTIHANCE GADOBENATE DIMEGLUMINE 529 MG/ML IV SOLN

[Series 3: T2 fat-sat · axial · 5.0mm · 0.86mm/px · 1 of 52 slices shown]
[im 1/52]
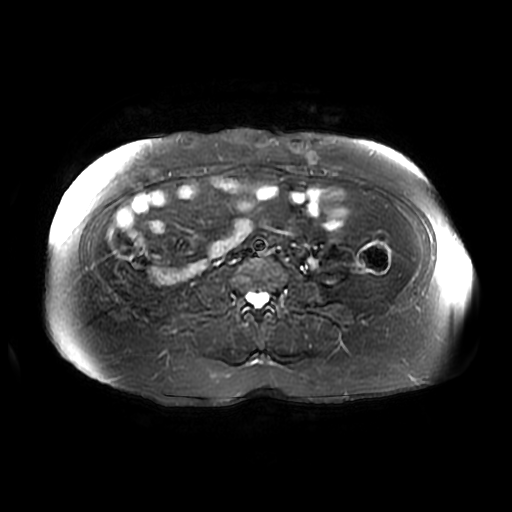

[Series 4: MRCP · coronal · 1.6mm · 0.62mm/px · 1 of 132 slices shown (1 of 3)]
[im 1/132]
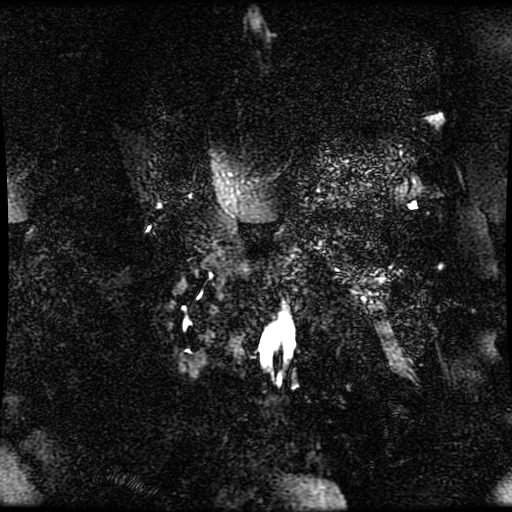

[Series 5: MRCP · coronal · 2.0mm · 0.70mm/px · 1 of 56 slices shown (2 of 3)]
[im 1/56]
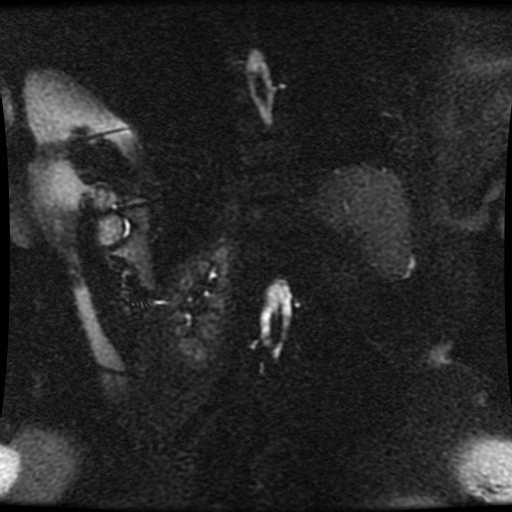

[Series 6: DWI b500 · axial · 6.0mm · 1.76mm/px · 1 of 78 slices shown]
[im 1/78]
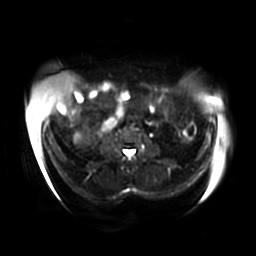

[Series 7: bSSFP fat-sat · coronal · 5.0mm · 0.78mm/px · 1 of 47 slices shown]
[im 1/47]
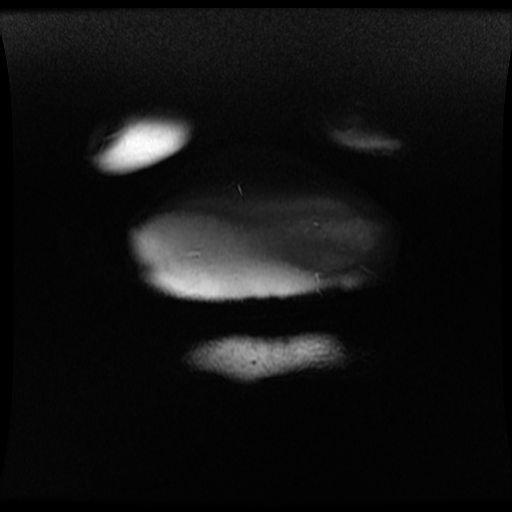

[Series 9: ax dualecho · axial · 5.0mm · 0.86mm/px · 1 of 104 slices shown]
[im 1/104]
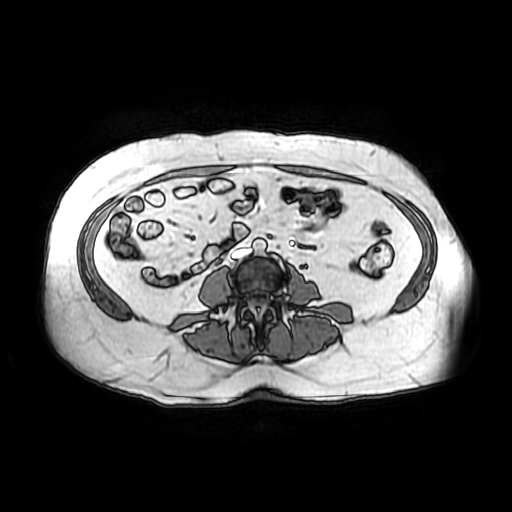

[Series 10: MRCP · coronal · 40.0mm · 0.70mm/px · 1 of 8 slices shown (3 of 3)]
[im 1/8]
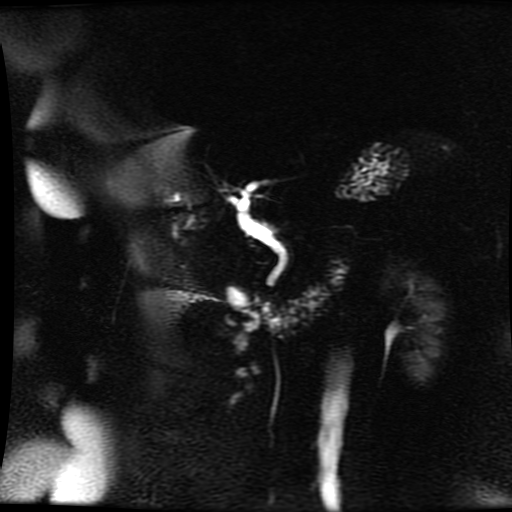

[Series 12: T1 dynamic · coronal · delayed · 4.0mm · 0.78mm/px · 1 of 112 slices shown (1 of 6)]
[im 1/112]
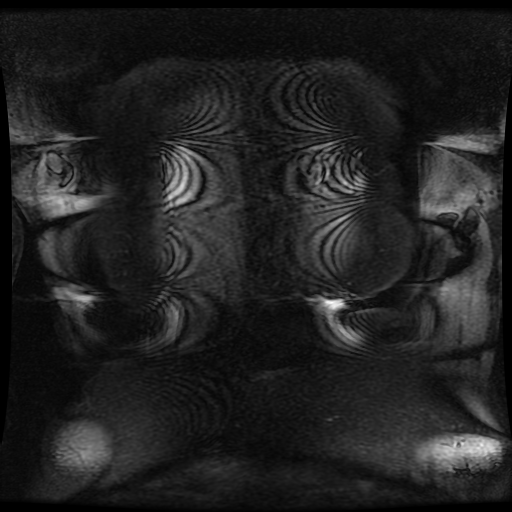

[Series 400: reformatted · axial · 1.6mm · 0.62mm/px · 1 of 92 slices shown]
[im 1/92]
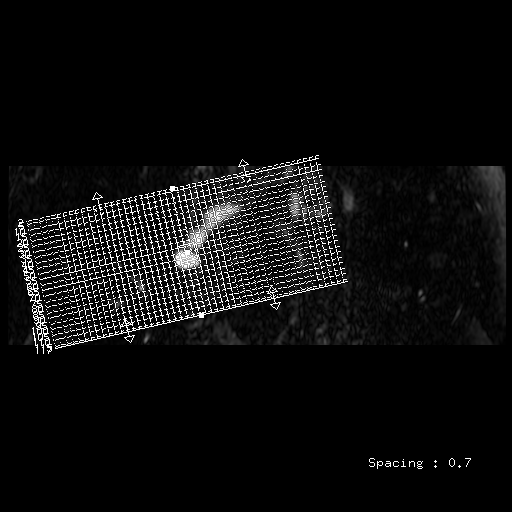

[Series 600: DWI · axial · 6.0mm · 1.76mm/px · 1 of 39 slices shown]
[im 1/39]
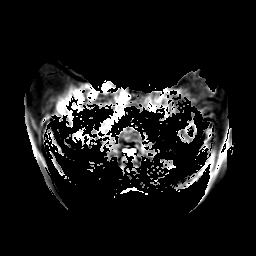

[Series 1100: T1 dynamic · axial · 5.6mm · 0.78mm/px · 1 of 88 slices shown (2 of 6)]
[im 1/88]
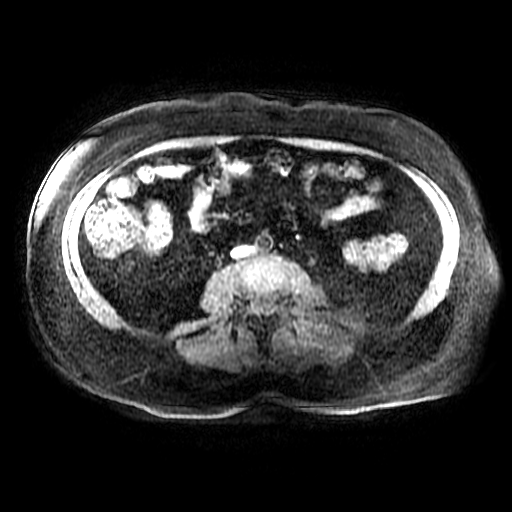

[Series 1101: T1 dynamic · axial · 5.6mm · 0.78mm/px · 1 of 88 slices shown (3 of 6)]
[im 1/88]
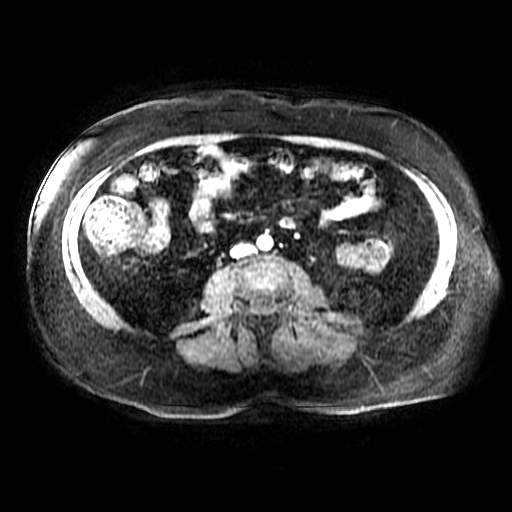

[Series 1102: T1 dynamic · axial · 5.6mm · 0.78mm/px · 1 of 88 slices shown (4 of 6)]
[im 1/88]
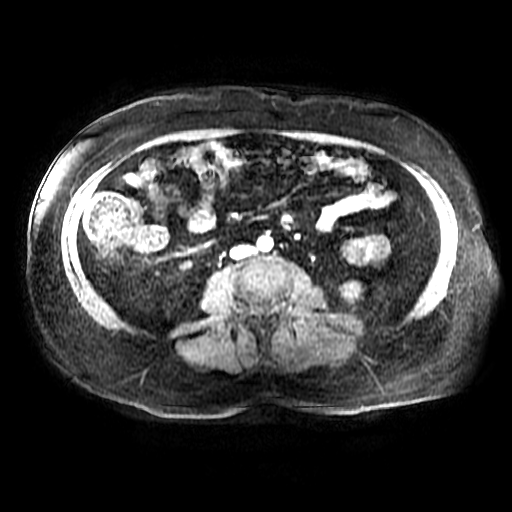

[Series 1103: T1 dynamic · axial · 5.6mm · 0.78mm/px · 1 of 88 slices shown (5 of 6)]
[im 1/88]
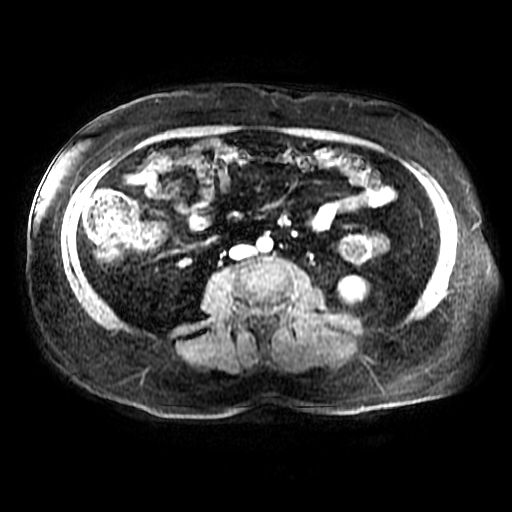

[Series 1104: T1 dynamic · axial · 5.6mm · 0.78mm/px · 1 of 88 slices shown (6 of 6)]
[im 1/88]
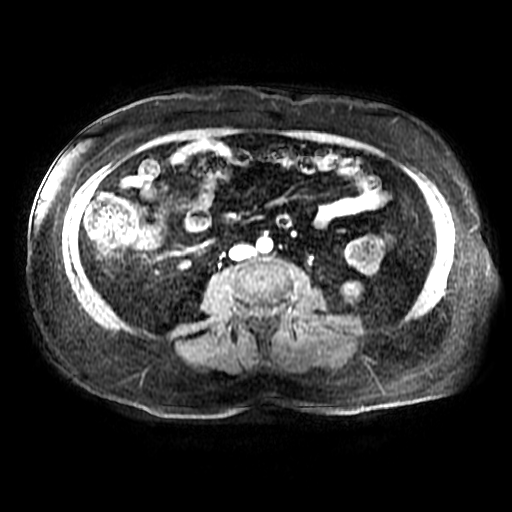

[((id)/(id)/1)-((id)/(id)/1) · axial · 5.6mm · 0.78mm/px · 1 of 87 slices shown (1 of 5)]
[im 1/87]
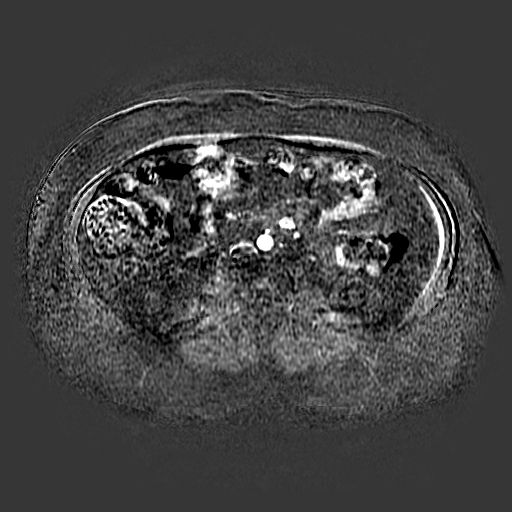

[((id)/(id)/1)-((id)/(id)/1) · axial · 5.6mm · 0.78mm/px · 1 of 88 slices shown (2 of 5)]
[im 1/88]
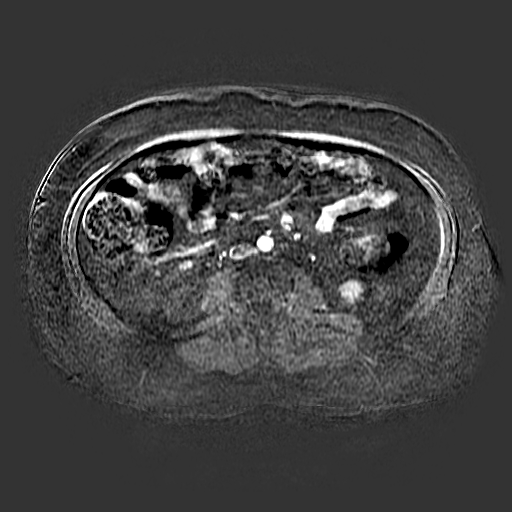

[((id)/(id)/1)-((id)/(id)/1) · axial · 5.6mm · 0.78mm/px · 1 of 88 slices shown (3 of 5)]
[im 1/88]
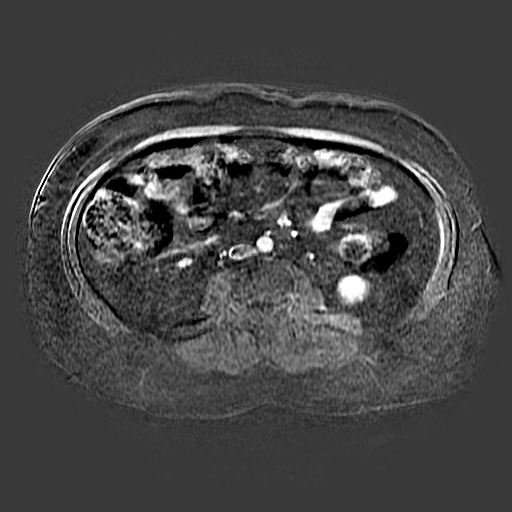

[((id)/(id)/1)-((id)/(id)/1) · axial · 5.6mm · 0.78mm/px · 1 of 88 slices shown (4 of 5)]
[im 1/88]
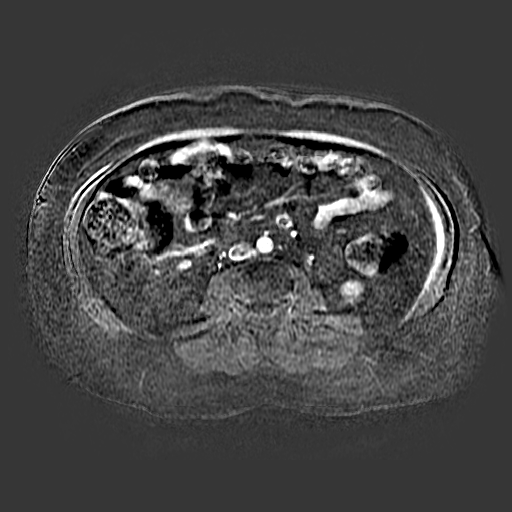

[((id)/(id)/1)-((id)/(id)/1) · axial · 5.6mm · 0.78mm/px · 1 of 88 slices shown (5 of 5)]
[im 1/88]
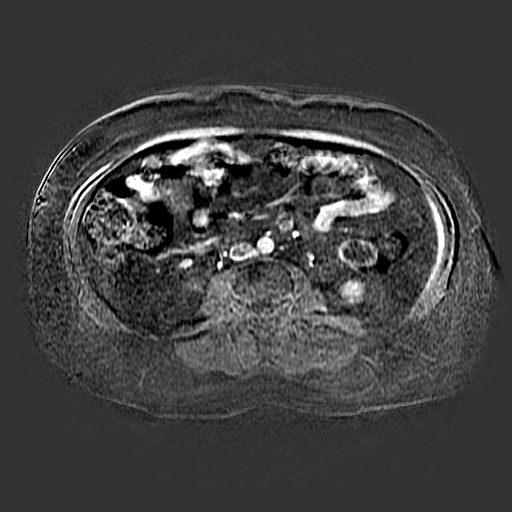

[20 of 22 positions shown; findings below may reference images not displayed]

FINDINGS: Lower chest: Mild subsegmental atelectasis in the right lower lobe
along the diaphragm. Mild cardiomegaly.

Hepatobiliary: The common hepatic duct measures 1.0 cm in diameter
and the common bile duct measures 0.5 cm in diameter. Images 5 and 6
of series 10 raise suspicion for a small distal CBD filling defect
measuring about 3 mm in diameter. On other sequences this apparent
filling defect is more of a bandlike appearance rather than a
rounded stone like filling defect appearance.

No significant abnormal hepatic enhancement. No abnormal enhancement
along the biliary tree.

Pancreas:  Unremarkable

Spleen:  Unremarkable

Adrenals/Urinary Tract:  Unremarkable

Stomach/Bowel: Nondistended stomach. No specific abnormality
identified in the stomach or upper abdominal bowel.

Vascular/Lymphatic:  Unremarkable

Other:  No supplemental non-categorized findings.

Musculoskeletal: Unremarkable
IMPRESSION: 1. Small indentation or filling defect along the posterior aspect of
the common bile duct, about 3 mm in diameter, causing some localized
narrowing. No abnormal enhancement in this vicinity. On some images
this has a slightly web like morphology. This certainly could
reflect a small stone, or a small focal stricture related to remote
inflammation. Given the patient's prior cholecystectomy, the current
biliary caliber is within normal limits.
2. Mild subsegmental atelectasis in the right lower lobe.
3. Mild cardiomegaly.

## 2019-09-01 ENCOUNTER — Encounter: Payer: Self-pay | Admitting: Gynecology

## 2022-11-02 ENCOUNTER — Ambulatory Visit: Payer: Self-pay

## 2022-11-02 ENCOUNTER — Ambulatory Visit
Admission: EM | Admit: 2022-11-02 | Discharge: 2022-11-02 | Disposition: A | Discharge status: Home / Self Care | Payer: Medicaid Other | Attending: Internal Medicine | Admitting: Internal Medicine

## 2022-11-02 DIAGNOSIS — N10 Acute tubulo-interstitial nephritis: Secondary | ICD-10-CM | POA: Diagnosis present

## 2022-11-02 DIAGNOSIS — M545 Low back pain, unspecified: Secondary | ICD-10-CM | POA: Diagnosis present

## 2022-11-02 LAB — POCT URINALYSIS DIP (MANUAL ENTRY): Protein Ur, POC: 30 mg/dL — AB

## 2022-11-02 MED ORDER — SULFAMETHOXAZOLE-TRIMETHOPRIM 800-160 MG PO TABS: 1.0000 | ORAL_TABLET | Freq: Two times a day (BID) | ORAL | 0 refills | Status: AC

## 2022-11-02 MED ORDER — CEFTRIAXONE SODIUM 1 G IJ SOLR
1.0000 g | Freq: Once | INTRAMUSCULAR | Status: AC
  Administered 2022-11-02: 1 g via INTRAMUSCULAR

## 2023-06-04 NOTE — Progress Notes (Signed)
 [address] - AMBULATORY [facility] [place] [phone] [place] [address]   June 04, 2023  Patient ID: Casey Brewer is a 67 y.o. female   Chief Complaint  Patient presents with  . Establish Care  . Tremors  . GERD     HPI: This is my first encounter with Casey Brewer, who is a 67 year old female with past medical history of GERD, herpes simplex and essential tremor, patient presented to the clinic to establish care and follow-up on tremors.  Patient reported having head tremor for about 6 years, she was diagnosed with essential tremors, he was given propranolol 10 mg twice daily and Biperiden 2 mg daily with no significantly improved.  She was seen by neurosurgery in [place] who also prescribed clonazepam however she never started this medication.  She continues to have tremors intermittently, moving head side-to-side.  No headaches, syncope or blurry vision.  Patient also reports severe gastritis, she is taking omeprazole  40 mg daily however burning blood.  She previously had endoscopy which apparently revealed chronic gastritis, at some point she was positive for H. pylori, however the patient does not recall getting treatment for this.  She denies any nausea/vomiting, diarrhea or constipation.  She looks well.  Her diet, avoiding spicy, citric processed food, she does not drink alcohol or smoke.  Omeprazole  does not fully relieve her symptoms.  Denies chest pain, shortness of breath, palpitations and dizziness.   ROS   A complete ROS was performed with pertinent positives/negatives noted in the HPI. The remainder of the ROS are negative.    Assessment/Plan:    [name] was seen today for establish care, tremors and gerd.  Diagnoses and all orders for this visit:  Essential tremor Head tremor, will increase propranolol to 20 mg twice daily, stop Biperiden for now.  Will refer to neurology  for further evaluation.  Orders: -     Ambulatory referral to Neurology; Future -     propranoloL (INDERAL) 20 mg tablet; Take 1 tablet (20 mg total) by mouth 2 (two) times a day.  Gastroesophageal reflux disease, unspecified whether esophagitis present Chronic, suspect some gastritis.  Will refer to GI for endoscopy.  Hold omeprazole  for 10 days and obtain stool for H. pylori testing.  Orders: -     Helicobacter pylori Stool Antigen; Future  Herpes simplex Chronic, asymptomatic.  Osteoporosis screening Orders: -     DEXA Scan 1 or More Sites Axial; Future  Encounter for screening mammogram for malignant neoplasm of breast Orders: -     [facility] Breast Screening Tomo Bilat; Future  Cervical cancer screening Orders: -     Ambulatory referral to Obstetrics / Gynecology; Future   Patient verbalizes understanding and in agreement with the above plan. All questions answered.  Medication side effects discussed with patient. Advised patient to call clinic or return for visit if symptoms occur. Goals of care discussed with patient including med compliance and adequate follow up.  Return in about 3 months (around 09/04/2023) for Annual physical.   Physical Exam    Vital Signs  Vitals:   06/04/23 1508  BP: 110/60  BP Location: Right arm  Patient Position: Sitting  Pulse: 79  Resp: 18  SpO2: 97%  Weight: 69.9 kg (154 lb 3.2 oz)  Height: 1.549 m (5' 1)     Constitutional: Well-developed and well-nourished. Sitting comfortably conversing normally. Pleasant.  Eyes: Conjunctivae clear and pink, no pallor,  EOM intact. Pupils are equal, round, and reactive to light. Anicteric. Neck: Supple. Non-tender thyroid , no thyromegaly. No JVD. Cardiovascular: +S1S2, regular rate and rhythm, no murmurs, gallops or rubs appreciated.  Respiratory: Normal effort, clear to auscultation bilaterally. No wheezes, rales or rhonchi noted.  Gastrointestinal: Bowel sounds present and normal. Soft.   Positive epigastric tenderness.  There is no hepatosplenomegaly. No rebound or guarding Extremities: No cyanosis, clubbing or edema. Pulses 2+ all 4 extremities. Neuro: Alert and oriented x 3. Cranial nerves II-XII grossly intact. Normal sensation and motor of all extremities.  Slow amplitudes primary, very sporadic had 2 episodes during office visit. Skin: Skin is warm and dry. No rash noted. Psychiatric: Behavior is Cooperative and Polite. Mood euthymyic. Affect is appropriate.      Medical History    There are no preventive care reminders to display for this patient.  Allergies Allergies  Allergen Reactions  . Dipyrone Palpitations    Medications Current Outpatient Medications  Medication Sig Dispense Refill  . NON FORMULARY 2 mg. Biperiden- takes 1/4 tablet at bedtime    . omeprazole  (PriLOSEC) 40 mg DR capsule Take 40 mg by mouth in the morning.    . propranoloL (INDERAL) 20 mg tablet Take 1 tablet (20 mg total) by mouth 2 (two) times a day. 180 tablet 1   No current facility-administered medications for this visit.    Medical history History reviewed. No pertinent past medical history.  Surgical History Past Surgical History:  Procedure Laterality Date  . BLADDER REPAIR    . CHOLECYSTECTOMY    . TONSILLECTOMY AND ADENOIDECTOMY    . TOTAL ABDOMINAL HYSTERECTOMY      Social History Social History   Tobacco Use  Smoking Status Never  Smokeless Tobacco Never    Family History Family History  Problem Relation Name Age of Onset  . Hypertension Mother    . Pacemaker Mother    . No Known Problems Father    . Colon cancer Neg Hx    . Breast cancer Neg Hx      I have reviewed and (if needed) updated the patient's problem list, medications, allergies, past medical and surgical history, social and family history.   This document serves as a record of services personally performed by Dr. Nikki.  It was created on their behalf by Delon Macario Fairly, LPN, a  trained medical scribe, and Licensed Practical Nurse (LPN). During the course of documenting the history, physical exam and medical decision making, I was functioning as a Stage manager. The creation of this record is the provider's dictation and/or activities during the visit.  Electronically signed by Delon Macario Fairly, LPN 01/09/7974 7:64 PM   I agree the documentation is accurate and complete.  Aliene Nikki, MD  Note - This document was created using aid of voice recognition Dragon dictation software

## 2023-07-24 ENCOUNTER — Ambulatory Visit
Admission: EM | Admit: 2023-07-24 | Discharge: 2023-07-24 | Disposition: A | Discharge status: Home / Self Care | Payer: Medicaid Other

## 2023-07-24 ENCOUNTER — Emergency Department (HOSPITAL_BASED_OUTPATIENT_CLINIC_OR_DEPARTMENT_OTHER): Payer: Medicaid Other

## 2023-07-24 ENCOUNTER — Emergency Department (HOSPITAL_BASED_OUTPATIENT_CLINIC_OR_DEPARTMENT_OTHER)
Admission: EM | Admit: 2023-07-24 | Discharge: 2023-07-24 | Disposition: A | Discharge status: Home / Self Care | Payer: Medicaid Other | Attending: Emergency Medicine | Admitting: Emergency Medicine

## 2023-07-24 ENCOUNTER — Other Ambulatory Visit: Payer: Self-pay

## 2023-07-24 ENCOUNTER — Encounter (HOSPITAL_BASED_OUTPATIENT_CLINIC_OR_DEPARTMENT_OTHER): Payer: Self-pay | Admitting: Radiology

## 2023-07-24 ENCOUNTER — Other Ambulatory Visit (HOSPITAL_BASED_OUTPATIENT_CLINIC_OR_DEPARTMENT_OTHER): Payer: Self-pay

## 2023-07-24 DIAGNOSIS — R1013 Epigastric pain: Secondary | ICD-10-CM | POA: Diagnosis present

## 2023-07-24 DIAGNOSIS — R1011 Right upper quadrant pain: Secondary | ICD-10-CM

## 2023-07-24 DIAGNOSIS — K2970 Gastritis, unspecified, without bleeding: Secondary | ICD-10-CM | POA: Diagnosis not present

## 2023-07-24 LAB — COMPREHENSIVE METABOLIC PANEL
ALT: 13 U/L (ref 0–44)
AST: 17 U/L (ref 15–41)
Albumin: 3.9 g/dL (ref 3.5–5.0)
Alkaline Phosphatase: 55 U/L (ref 38–126)
Anion gap: 6 (ref 5–15)
BUN: 11 mg/dL (ref 8–23)
CO2: 29 mmol/L (ref 22–32)
Calcium: 9.2 mg/dL (ref 8.9–10.3)
Chloride: 104 mmol/L (ref 98–111)
Creatinine, Ser: 0.77 mg/dL (ref 0.44–1.00)
GFR, Estimated: >60 mL/min (ref >60)
Glucose, Bld: 91 mg/dL (ref 70–99)
Potassium: 4.5 mmol/L (ref 3.5–5.1)
Sodium: 139 mmol/L (ref 135–145)
Total Bilirubin: 0.6 mg/dL (ref 0.3–1.2)
Total Protein: 7.2 g/dL (ref 6.5–8.1)

## 2023-07-24 LAB — URINALYSIS, ROUTINE W REFLEX MICROSCOPIC
Bacteria, UA: NONE SEEN
Bilirubin Urine: NEGATIVE
Glucose, UA: NEGATIVE mg/dL
Hgb urine dipstick: NEGATIVE
Ketones, ur: NEGATIVE mg/dL
Nitrite: NEGATIVE
Protein, ur: NEGATIVE mg/dL
Specific Gravity, Urine: 1.016 (ref 1.005–1.030)
pH: 7 (ref 5.0–8.0)

## 2023-07-24 LAB — CBC WITH DIFFERENTIAL/PLATELET
Abs Immature Granulocytes: 0.02 10*3/uL (ref 0.00–0.07)
Basophils Absolute: 0.1 10*3/uL (ref 0.0–0.1)
Basophils Relative: 1 %
Eosinophils Absolute: 0.4 10*3/uL (ref 0.0–0.5)
Eosinophils Relative: 4 %
HCT: 42.2 % (ref 36.0–46.0)
Hemoglobin: 13.9 g/dL (ref 12.0–15.0)
Immature Granulocytes: 0 %
Lymphocytes Relative: 26 %
Lymphs Abs: 2.2 10*3/uL (ref 0.7–4.0)
MCH: 29.1 pg (ref 26.0–34.0)
MCHC: 32.9 g/dL (ref 30.0–36.0)
MCV: 88.5 fL (ref 80.0–100.0)
Monocytes Absolute: 0.6 10*3/uL (ref 0.1–1.0)
Monocytes Relative: 7 %
Neutro Abs: 5.3 10*3/uL (ref 1.7–7.7)
Neutrophils Relative %: 62 %
Platelets: 339 10*3/uL (ref 150–400)
RBC: 4.77 MIL/uL (ref 3.87–5.11)
RDW: 13.6 % (ref 11.5–15.5)
WBC: 8.5 10*3/uL (ref 4.0–10.5)
nRBC: 0 % (ref 0.0–0.2)

## 2023-07-24 LAB — LIPASE, BLOOD: Lipase: 20 U/L (ref 11–51)

## 2023-07-24 MED ORDER — IOHEXOL 300 MG/ML  SOLN
100.0000 mL | Freq: Once | INTRAMUSCULAR | Status: AC | PRN
  Administered 2023-07-24: 75 mL via INTRAVENOUS

## 2023-07-24 MED ORDER — SUCRALFATE 1 GM/10ML PO SUSP
1.0000 g | Freq: Three times a day (TID) | ORAL | 1 refills | Status: DC
  Filled 2023-07-24: qty 340, 9d supply, fill #0

## 2023-07-24 MED ORDER — FAMOTIDINE 20 MG PO TABS
20.0000 mg | ORAL_TABLET | Freq: Two times a day (BID) | ORAL | 1 refills | Status: DC
  Filled 2023-07-24: qty 30, 15d supply, fill #0

## 2023-07-24 MED ORDER — OMEPRAZOLE 40 MG PO CPDR
40.0000 mg | DELAYED_RELEASE_CAPSULE | Freq: Every day | ORAL | 0 refills | Status: DC
  Filled 2023-07-24: qty 30, 30d supply, fill #0

## 2023-07-24 NOTE — ED Triage Notes (Signed)
Patient presents to [facility] for abdominal pain, nausea x 2 days. States she was treated for H. Pylori infection about a month ago. States she is concerned with her liver function.   Denies fever and vomiting.

## 2023-07-24 NOTE — ED Notes (Signed)
Patient is being discharged from the Urgent Care and sent to the Emergency Department via POV with daughter. Per Izola Price, [place], patient is in need of higher level of care due to needing abdominal imaging. Patient is aware and verbalizes understanding of plan of care.  Vitals:   07/24/23 0808  BP: 129/83  Pulse: (!) 57  Resp: 16  Temp: 98.3 F (36.8 C)  SpO2: 95%

## 2023-07-24 NOTE — ED Provider Notes (Signed)
Patient here today for evaluation of right upper quadrant pain and nausea that she has had for 2 days.  She does have history of H. pylori.  She states she was treated for one month ago and symptoms resolved.  She is unsure if current symptoms are related.  She has not had any fever and denies vomiting.  Recommended further evaluation in the emergency room for stat imaging and labs.  Patient is agreeable to same.   Tomi Bamberger, PA-C 07/24/23 [phone]

## 2023-07-24 NOTE — ED Notes (Signed)
Pt resting in bed, talking with family member, no needs at this time, no apparent distress.

## 2023-07-24 NOTE — ED Notes (Signed)
ED Provider at bedside. 

## 2023-07-24 NOTE — ED Provider Notes (Signed)
[place] EMERGENCY DEPARTMENT AT [facility] Provider Note   CSN: [id number] Arrival date & time: 07/24/23  7829     History  Chief Complaint  Patient presents with   Abdominal Pain    Casey Brewer is a 67 y.o. female.  HPI Patient took 2 weeks of medications for H. pylori which she has completed and finished.  She reports she has some ongoing pain in her upper abdomen.  She indicates the epigastrium slightly to the right.  No vomiting.  Patient has had some nausea.  She has already had cholecystectomy.  No urinary symptoms.  No pain no burning or urgency.  No pain radiating into the back.  No pain radiating into the chest.  No shortness of breath.  Since completing H. pylori treatment, patient takes daily omeprazole.  She is also taking propranolol for facial twitching.    Home Medications Prior to Admission medications   Medication Sig Start Date End Date Taking? Authorizing Provider  famotidine (PEPCID) 20 MG tablet Take 1 tablet (20 mg total) by mouth 2 (two) times daily. 07/24/23  Yes Arby Barrette, MD  omeprazole (PRILOSEC) 40 MG capsule Take 1 capsule (40 mg total) by mouth daily. 07/24/23  Yes Kaytlyn Din, Lebron Conners, MD  PROCTO-MED HC 2.5 % rectal cream Place 1 Application rectally 2 (two) times daily. 07/10/23  Yes [provider]  propranolol (INDERAL) 20 MG tablet Take 20 mg by mouth 2 (two) times daily. 06/04/23  Yes [provider]  sucralfate (CARAFATE) 1 GM/10ML suspension Take 10 mLs (1 g total) by mouth 4 (four) times daily -  with meals and at bedtime. 07/24/23  Yes Arby Barrette, MD  hydrOXYzine (ATARAX/VISTARIL) 25 MG tablet Take 1-2 tablets (25-50 mg total) by mouth every 4 (four) hours as needed. 05/11/19   Eustace Moore, MD  omeprazole (PRILOSEC) 40 MG capsule Take 40 mg by mouth every morning.    [provider]  UNABLE TO FIND Take 0.5 tablets by mouth daily. Med Name: Adepsique (Formula: Amitriptilina 10mg  Bing  3mg Iverson Alamin 2 mg)    [provider]  valACYclovir (VALTREX) 1000 MG tablet Take 1 tablet (1,000 mg total) by mouth daily. 09/09/18   Noni Saupe, MD      Allergies    Dipyrone    Review of Systems   Review of Systems  Physical Exam Updated Vital Signs BP 124/74   Pulse (!) 49   Temp 98.2 F (36.8 C) (Oral)   Resp 19   SpO2 100%  Physical Exam Constitutional:      Comments: Alert nontoxic well in appearance.  Well-nourished well-developed.  No respiratory distress.  HENT:     Head: Normocephalic and atraumatic.     Mouth/Throat:     Pharynx: Oropharynx is clear.  Eyes:     Extraocular Movements: Extraocular movements intact.  Cardiovascular:     Rate and Rhythm: Normal rate and regular rhythm.  Pulmonary:     Effort: Pulmonary effort is normal.     Breath sounds: Normal breath sounds.  Abdominal:     Comments: Abdomen soft.  Moderately reproducible discomfort in the upper abdomen slightly right of midline.  No significant tenderness to palpation of the head hepatic margin.  No guarding.  Lower abdomen nontender.  Musculoskeletal:        General: No swelling or tenderness. Normal range of motion.     Right lower leg: No edema.     Left lower leg: No edema.  Skin:  General: Skin is warm and dry.  Neurological:     General: No focal deficit present.     Mental Status: She is oriented to person, place, and time.     Coordination: Coordination normal.  Psychiatric:        Mood and Affect: Mood normal.     ED Results / Procedures / Treatments   Labs (all labs ordered are listed, but only abnormal results are displayed) Labs Reviewed  URINALYSIS, ROUTINE W REFLEX MICROSCOPIC - Abnormal; Notable for the following components:      Result Value   Leukocytes,Ua SMALL (*)    All other components within normal limits  COMPREHENSIVE METABOLIC PANEL  LIPASE, BLOOD  CBC WITH DIFFERENTIAL/PLATELET    EKG None  Radiology CT ABDOMEN PELVIS W  CONTRAST  Result Date: 07/24/2023 CLINICAL DATA:  Right upper quadrant pain. On treatment for H pylori. EXAM: CT ABDOMEN AND PELVIS WITH CONTRAST TECHNIQUE: Multidetector CT imaging of the abdomen and pelvis was performed using the standard protocol following bolus administration of intravenous contrast. RADIATION DOSE REDUCTION: This exam was performed according to the departmental dose-optimization program which includes automated exposure control, adjustment of the mA and/or kV according to patient size and/or use of iterative reconstruction technique. CONTRAST:  75mL OMNIPAQUE IOHEXOL 300 MG/ML  SOLN COMPARISON:  MRI 06/16/2018 FINDINGS: Lower chest: Breathing motion lung bases. Slight bandlike changes, likely scar or atelectasis. No pleural effusion Hepatobiliary: No focal liver abnormality is seen. Status post cholecystectomy. No biliary dilatation. Patent portal vein. Pancreas: Unremarkable. No pancreatic ductal dilatation or surrounding inflammatory changes. Spleen: Normal in size without focal abnormality. Adrenals/Urinary Tract: Adrenal glands are unremarkable. Kidneys are normal, without renal calculi, focal lesion, or hydronephrosis. Bladder is unremarkable. Stomach/Bowel: Large bowel has moderate colonic stool. Normal appendix. No small or large bowel dilatation. The stomach is underdistended but there is some mild fold thickening proximally. Please correlate for any symptoms Vascular/Lymphatic: Normal caliber aorta and IVC. No specific abnormal lymph node enlargement identified in the abdomen pelvis except for some prominent right-sided hemi abdominal mesenteric nodes. Example series 2, image 47 measures 18 by 9 mm. Slightly smaller but prominent focus on image 50 of series 2. Nonspecific. Reproductive: Status post hysterectomy. No adnexal masses. Other: No free air or free fluid Musculoskeletal: Curvature and degenerative changes along the spine. IMPRESSION: Normal appendix. Scattered colonic stool.  No obstruction or free air. Previous cholecystectomy. There are some prominent, borderline enlarged right lower quadrant mesenteric lymph nodes of uncertain etiology and significance. Recommend correlation any known history or prior imaging of the area or dedicated follow-up in 3 months. Slight fold thickening of the stomach. Please correlate with any symptoms Electronically Signed   By: Karen Kays M.D.   On: 07/24/2023 12:06    Procedures Procedures    Medications Ordered in ED Medications  iohexol (OMNIPAQUE) 300 MG/ML solution 100 mL (75 mLs Intravenous Contrast Given 07/24/23 1050)    ED Course/ Medical Decision Making/ A&P                                 Medical Decision Making Amount and/or Complexity of Data Reviewed Labs: ordered. Radiology: ordered.  Risk Prescription drug management.   Patient has abdominal pain.  She has previously had cholecystectomy and hysterectomy.  Differential diagnosis includes pancreatitis\unspecified hepatitis\gastritis\enteritis\bowel obstruction\pyelonephritis.  Will proceed with diagnostic evaluation.  Comprehensive metabolic panel with LFTs is normal.  Lipase is normal at 20.  Urinalysis normal.  CBC with differential normal. CT scan interpreted as follows. Previous cholecystectomy. There are some prominent, borderline  enlarged right lower quadrant mesenteric lymph nodes of uncertain  etiology and significance. Recommend correlation any known history  or prior imaging of the area or dedicated follow-up in 3 months.    Slight fold thickening of the stomach. Please correlate with any  symptoms   At this time patient is clinically well in appearance.  Labs are stable.  CT shows some thickening of the stomach which would be consistent with the patient's symptoms.  Her pain is mostly slightly left of midline and does not seem to involve the hepatic margin.  Patient has this prior recent diagnosis of H. pylori thus gastritis or ulcer seems  likely.  Patient has more recently run out of omeprazole.  At this time we will have her take omeprazole and add Carafate and Pepcid for 2 weeks.  Patient and her daughter were made aware through Spanish interpreter of small lymph nodes and necessity for close follow-up especially with gastroenterology to monitor for the possibility of any indolent process such as a stomach cancer rather than gastritis.  They voiced understanding.  I included the contact information for Dr. Meridee Score who is seen for upper endoscopy in 2019.  They are also counseled to follow-up with their PCP to help facilitate follow-up and monitoring of CT results.  They voiced understanding.          Final Clinical Impression(s) / ED Diagnoses Final diagnoses:  Gastritis without bleeding, unspecified chronicity, unspecified gastritis type  Epigastric pain    Rx / DC Orders ED Discharge Orders          Ordered    omeprazole (PRILOSEC) 40 MG capsule  Daily        07/24/23 1325    sucralfate (CARAFATE) 1 GM/10ML suspension  3 times daily with meals & bedtime        07/24/23 1325    famotidine (PEPCID) 20 MG tablet  2 times daily        07/24/23 1325              Arby Barrette, MD 07/24/23 1332

## 2023-07-24 NOTE — Discharge Instructions (Signed)
Please report to [facility]  [address] [place], [place]

## 2023-07-24 NOTE — ED Notes (Signed)
Pt ambulated to the restroom and back to room with even steady gait, no apparent distress.

## 2023-07-24 NOTE — ED Triage Notes (Signed)
Pt arrives pov, steady gait c/o RUQ pain, referred by UC, [place] x 3 weeks pta for h Pylori. Endorses nausea [id number]

## 2023-07-24 NOTE — ED Notes (Signed)
Pt returned from CT with tech via w/c and was placed back on the BP and SpO2 monitor.

## 2023-07-24 NOTE — ED Notes (Signed)
Pt discharged in stable condition. Pt and family expressed understanding about discharge instructions and Rx, and to follow up with pcp and to return to ER for any further concerns or complications. Pt ambulated out with even steady gait, no apparent distress.

## 2023-07-24 NOTE — Discharge Instructions (Addendum)
1. Tome omeprazol todos los [address]  [address] por la [place]. tomar carafate 4 veces al da [employer]. tome pepcid dos veces al da durante 2 semanas. 2. haga un seguimiento con su mdico en 1-2 semanas. Haga un seguimiento con [employer] officer, Dr. Meridee Score, lo antes posible. La informacin de contacto se encuentra en sus instrucciones de alta. 3. regrese al departamento de emergencias si sus sntomas empeoran.

## 2023-07-25 ENCOUNTER — Encounter (HOSPITAL_BASED_OUTPATIENT_CLINIC_OR_DEPARTMENT_OTHER): Payer: Self-pay | Admitting: Radiology

## 2023-07-26 ENCOUNTER — Encounter: Payer: Self-pay | Admitting: Physician Assistant

## 2023-07-30 NOTE — Progress Notes (Signed)
 [address] - AMBULATORY [facility]  - [facility]NAL MEDICINE [place] [phone] [place] [address]   July 30, 2023  Patient ID: Casey Brewer is a 67 y.o. female   Chief Complaint  Patient presents with  . ED follow up- Abdominal pain  . Hemorrhoids     HPI: Casey Brewer present to the office for an ED follow up from [facility] ED on 07/24/23 for abdominal pain.  She was diagnosed with gastritis, recommended famotidine  and Carafate .  She was previously diagnosed with H. pylori, she completed quadruple therapy and has not retested.  Since she stopped taking omeprazole  her abdominal pain has returned.  Patient also complaining about hemorrhoids, she was using hydrocortisone  cream with no significant improvement.  She denies any constipation.  After she completed the treatment for H. pylori her bowels have been more frequent however regular formation.  Denies diarrhea.  No blood in the stool noted.  She does have some itchiness.  No other concerns.   ROS    A complete ROS was performed with pertinent positives/negatives noted in the HPI. The remainder of the ROS are negative.    Assessment/Plan:    [name] was seen today for ed follow up- abdominal pain and hemorrhoids.  Diagnoses and all orders for this visit:  Gastroesophageal reflux disease, unspecified whether esophagitis present She was referred to GI, she has an appointment on 8/30 at [facility] gastroenterology.  Advised to keep pending appointment.  Resume omeprazole , she already gave us  H. pylori sample.  Results pending.  She needs EGD.  Orders: -     omeprazole  (PriLOSEC) 40 mg DR capsule; Take 1 capsule (40 mg total) by mouth in the morning.  Hemorrhoids, unspecified hemorrhoid type Chronic, continue Anusol , advised to mix with Preparation H and use twice a day.  Orders: -     hydrocortisone  (Procto-Med HC ) 2.5 % rectal cream; Insert 1 Application into the rectum 2  (two) times a day.   Patient verbalizes understanding and in agreement with the above plan. All questions answered.  Medication side effects discussed with patient. Advised patient to call clinic or return for visit if symptoms occur. Goals of care discussed with patient including med compliance and adequate follow up.  Return for Next scheduled follow up.   Physical Exam    Vital Signs  Vitals:   07/30/23 0955  BP: 102/60  BP Location: Left arm  Patient Position: Sitting  Pulse: 62  Resp: 18  SpO2: 98%  Weight: 69.9 kg (154 lb)  Height: 1.549 m (5' 1)    Constitutional: Well-developed and well-nourished. Sitting comfortably conversing normally. Pleasant.  Cardiovascular: +S1S2, regular rate and rhythm, no murmurs, gallops or rubs appreciated.  Respiratory: Normal effort, clear to auscultation bilaterally. No wheezes, rales or rhonchi noted.  Gastrointestinal: Abdomen soft, nondistended. Mild epigastric tenderness. Positive bowel sounds, no organomegaly noted.  Extremities: No cyanosis, clubbing or edema. Pulses 2+ all 4 extremities. Skin: Skin is warm and dry. No rash noted.   Medical History    Health Maintenance reviewed.  Allergies Allergies  Allergen Reactions  . Dipyrone Palpitations    Medications Current Outpatient Medications  Medication Sig Dispense Refill  . hydrocortisone  (Procto-Med HC ) 2.5 % rectal cream Insert 1 Application into the rectum 2 (two) times a day. 30 g 1  . omeprazole  (PriLOSEC) 40 mg DR capsule Take 1 capsule (40 mg total) by mouth in the morning. 90 capsule 1  . sucralfate  (CARAFATE )  100 mg/mL oral suspension Take 1 g by mouth. (Patient not taking: Reported on 07/30/2023)     No current facility-administered medications for this visit.    Medical history Past Medical History:  Diagnosis Date  . Herpesvirus 2     Surgical History Past Surgical History:  Procedure Laterality Date  . BLADDER REPAIR    . CHOLECYSTECTOMY    .  TONSILLECTOMY AND ADENOIDECTOMY    . TOTAL ABDOMINAL HYSTERECTOMY      Social History Social History   Tobacco Use  Smoking Status Never  Smokeless Tobacco Never    Family History Family History  Problem Relation Name Age of Onset  . Hypertension Mother    . Pacemaker Mother    . No Known Problems Father    . Colon cancer Neg Hx    . Breast cancer Neg Hx      I have reviewed and (if needed) updated the patient's problem list, medications, allergies, past medical and surgical history, social and family history.  This document serves as a record of services personally performed by Dr. Nikki.  It was created on their behalf by Delon Macario Fairly, LPN, a trained medical scribe, and Licensed Practical Nurse (LPN). During the course of documenting the history, physical exam and medical decision making, I was functioning as a Stage manager. The creation of this record is the provider's dictation and/or activities during the visit.  Electronically signed by Delon Macario Fairly, LPN 1/72/7975 2:92 AM  I agree the documentation is accurate and complete.  Aliene Nikki, MD  Note - This document was created using aid of voice recognition Dragon dictation software

## 2023-08-02 ENCOUNTER — Encounter: Payer: Self-pay | Admitting: Physician Assistant

## 2023-08-02 ENCOUNTER — Ambulatory Visit: Payer: Medicaid Other | Admitting: Physician Assistant

## 2023-08-02 VITALS — BP 116/62 | HR 68 | Ht 62.0 in | Wt 154.0 lb

## 2023-08-02 DIAGNOSIS — K648 Other hemorrhoids: Secondary | ICD-10-CM

## 2023-08-02 DIAGNOSIS — R110 Nausea: Secondary | ICD-10-CM | POA: Diagnosis not present

## 2023-08-02 DIAGNOSIS — R935 Abnormal findings on diagnostic imaging of other abdominal regions, including retroperitoneum: Secondary | ICD-10-CM | POA: Diagnosis not present

## 2023-08-02 DIAGNOSIS — B9681 Helicobacter pylori [H. pylori] as the cause of diseases classified elsewhere: Secondary | ICD-10-CM

## 2023-08-02 DIAGNOSIS — K2970 Gastritis, unspecified, without bleeding: Secondary | ICD-10-CM

## 2023-08-02 MED ORDER — ONDANSETRON HCL 4 MG PO TABS: 4.0000 mg | ORAL_TABLET | ORAL | 2 refills | Status: DC | PRN

## 2023-08-02 NOTE — Progress Notes (Signed)
Chief Complaint: Right upper quadrant pain  HPI:    This is Casey Brewer is a 67 year old Hispanic female, previously known to Dr. Christella Hartigan, assigned to Dr. Meridee Score at hospitalization, with a past medical history as listed below, who was referred to me by Randel Pigg, Dorma Russell, MD for a complaint of right upper quadrant pain.    09/01/2018 EUS for common bile duct dilation-at the time EGD findings with potential developing traction diverticulum at the right side of the UES, medium sized hiatal hernia, EUS with no sign of significant pathology in the common bile duct.    07/24/2023 patient seen early in the urgent care for right upper quadrant pain and nausea for 2 days.  Discussed a history of H. pylori which was treated a month ago with resolution of symptoms.  She was sent to the ED for imaging.    07/24/2023 ER visit for continued right upper quadrant pain.  Had finished 2 weeks of medications for H. pylori.  Associated symptoms include nausea.  Discussed previous cholecystectomy.  Urinalysis, CMP, lipase and CBC normal.  CTAP with contrast showed normal appendix, scattered colonic stool, no obstruction or free air.  Previous cholecystectomy, prominent/borderline enlarged right lower quadrant mesenteric lymph nodes of uncertain etiology and significance.  Recommended correlation with any known history of prior imaging of the area or dedicated follow-up in 3 months.  Also slight fold thickening of the stomach.  That time discussed that she had run out of Omeprazole recently.  She was restarted on this and Carafate and Pepcid.    07/30/2023 patient had H. pylori stool antigen testing which was positive.  Patient restarted on Prilosec, Levaquin and Amoxicillin.    Today, patient presents to clinic accompanied by her daughter and her granddaughter on the phone who acts as interpreter for half the visit until the interpreter shows up.  She explains that she was having severe epigastric/right upper quadrant pain when  she went to the ER but since restarting all the medications for her H. pylori just the other day she has had no further pain.  She has 2 weeks of medicine.  This does make her little nauseous so so they are asking for medicine for nausea.    Also has questions regarding recent CT and question of ulcer from ER visit.  Also explained she battles with hemorrhoids and has never had a colonoscopy so was recommended that she likely get one of these.  Describes occasional blood on the toilet paper, currently not an issue for her.    Denies fever, chills, weight loss, vomiting or symptoms that awaken her from sleep.  Past Medical History:  Diagnosis Date   Complication of anesthesia    "air escaped from hole in my nose and was in my face"   Depression     Past Surgical History:  Procedure Laterality Date   ABDOMINAL HYSTERECTOMY     BLADDER SURGERY     CHOLECYSTECTOMY     ESOPHAGOGASTRODUODENOSCOPY N/A 09/01/2018   Procedure: ESOPHAGOGASTRODUODENOSCOPY (EGD);  Surgeon: Lemar Lofty., MD;  Location: [facility] ENDOSCOPY;  Service: Gastroenterology;  Laterality: N/A;   EUS N/A 09/01/2018   Procedure: UPPER ENDOSCOPIC ULTRASOUND (EUS) LINEAR;  Surgeon: Lemar Lofty., MD;  Location: [facility] ENDOSCOPY;  Service: Gastroenterology;  Laterality: N/A;   THROAT SURGERY      Current Outpatient Medications  Medication Sig Dispense Refill   amoxicillin (AMOXIL) 500 MG capsule Take 500 mg by mouth 3 (three) times daily.     famotidine (PEPCID)  20 MG tablet Take 1 tablet (20 mg total) by mouth 2 (two) times daily. 30 tablet 1   hydrOXYzine (ATARAX/VISTARIL) 25 MG tablet Take 1-2 tablets (25-50 mg total) by mouth every 4 (four) hours as needed. 20 tablet 0   levofloxacin (LEVAQUIN) 500 MG tablet Take 500 mg by mouth daily.     omeprazole (PRILOSEC) 40 MG capsule Take 40 mg by mouth every morning.     omeprazole (PRILOSEC) 40 MG capsule Take 1 capsule (40 mg total) by mouth daily. 30 capsule 0    PROCTO-MED HC 2.5 % rectal cream Place 1 Application rectally 2 (two) times daily.     propranolol (INDERAL) 20 MG tablet Take 20 mg by mouth 2 (two) times daily.     sucralfate (CARAFATE) 1 GM/10ML suspension Take 10 mLs (1 g total) by mouth 4 (four) times daily -  with meals and at bedtime. 420 mL 1   UNABLE TO FIND Take 0.5 tablets by mouth daily. Med Name: Adepsique (Formula: Amitriptilina 10mg [place] 3mg Iverson Alamin 2 mg)     valACYclovir (VALTREX) 1000 MG tablet Take 1 tablet (1,000 mg total) by mouth daily. 5 tablet 3   No current facility-administered medications for this visit.    Allergies as of 08/02/2023 - Review Complete 08/02/2023  Allergen Reaction Noted   Dipyrone Palpitations 07/31/2018    Family History  Problem Relation Age of Onset   Heart disease Mother    Healthy Father     Social History   Socioeconomic History   Marital status: Married    Spouse name: Not on file   Number of children: Not on file   Years of education: Not on file   Highest education level: Not on file  Occupational History   Not on file  Tobacco Use   Smoking status: Never   Smokeless tobacco: Never  Vaping Use   Vaping status: Never Used  Substance and Sexual Activity   Alcohol use: No    Alcohol/week: 0.0 standard drinks of alcohol   Drug use: Not Currently   Sexual activity: Not on file  Other Topics Concern   Not on file  Social History Narrative   ** Merged History Encounter **       Social Determinants of Health   Financial Resource Strain: Not on file  Food Insecurity: Not on file  Transportation Needs: Not on file  Physical Activity: Not on file  Stress: Not on file  Social Connections: Not on file  Intimate Partner Violence: Not on file    Review of Systems:    Constitutional: No weight loss, fever or chills Skin: No rash  Cardiovascular: No chest pain Respiratory: No SOB  Gastrointestinal: See HPI and otherwise negative Genitourinary: No dysuria or  change in urinary frequency Neurological: No headache, dizziness or syncope Musculoskeletal: No new muscle or joint pain Hematologic: No bleeding  Psychiatric: No history of depression or anxiety   Physical Exam:  Vital signs: BP 116/62   Pulse 68   Ht 5\' 2"  (1.575 m)   Wt 154 lb (69.9 kg)   BMI 28.17 kg/m    Constitutional:   Pleasant Hispanic female appears to be in NAD, Well developed, Well nourished, alert and cooperative Head:  Normocephalic and atraumatic. Eyes:   PEERL, EOMI. No icterus. Conjunctiva pink. Ears:  Normal auditory acuity. Neck:  Supple Throat: Oral cavity and pharynx without inflammation, swelling or lesion.  Respiratory: Respirations even and unlabored. Lungs clear to auscultation bilaterally.   No wheezes, crackles,  or rhonchi.  Cardiovascular: Normal S1, S2. No MRG. Regular rate and rhythm. No peripheral edema, cyanosis or pallor.  Gastrointestinal:  Soft, nondistended, nontender. No rebound or guarding. Normal bowel sounds. No appreciable masses or hepatomegaly. Rectal:  Not performed.  Msk:  Symmetrical without gross deformities. Without edema, no deformity or joint abnormality.  Neurologic:  Alert and  oriented x4;  grossly normal neurologically.  Skin:   Dry and intact without significant lesions or rashes. Psychiatric: Demonstrates good judgement and reason without abnormal affect or behaviors.  RELEVANT LABS AND IMAGING: CBC    Component Value Date/Time   WBC 8.5 07/24/2023 0918   RBC 4.77 07/24/2023 0918   HGB 13.9 07/24/2023 0918   HGB 14.0 04/22/2018 1624   HCT 42.2 07/24/2023 0918   HCT 41.3 04/22/2018 1624   PLT 339 07/24/2023 0918   PLT 301 04/22/2018 1624   MCV 88.5 07/24/2023 0918   MCV 85 04/22/2018 1624   MCH 29.1 07/24/2023 0918   MCHC 32.9 07/24/2023 0918   RDW 13.6 07/24/2023 0918   RDW 14.4 04/22/2018 1624   LYMPHSABS 2.2 07/24/2023 0918   LYMPHSABS 2.9 04/22/2018 1624   MONOABS 0.6 07/24/2023 0918   EOSABS 0.4 07/24/2023  0918   EOSABS 0.3 04/22/2018 1624   BASOSABS 0.1 07/24/2023 0918   BASOSABS 0.1 04/22/2018 1624    CMP     Component Value Date/Time   NA 139 07/24/2023 0918   NA 141 04/22/2018 1624   K 4.5 07/24/2023 0918   CL 104 07/24/2023 0918   CO2 29 07/24/2023 0918   GLUCOSE 91 07/24/2023 0918   BUN 11 07/24/2023 0918   BUN 12 04/22/2018 1624   CREATININE 0.77 07/24/2023 0918   CREATININE 0.75 08/23/2015 1156   CALCIUM 9.2 07/24/2023 0918   PROT 7.2 07/24/2023 0918   PROT 7.2 04/22/2018 1624   ALBUMIN 3.9 07/24/2023 0918   ALBUMIN 4.4 04/22/2018 1624   AST 17 07/24/2023 0918   ALT 13 07/24/2023 0918   ALKPHOS 55 07/24/2023 0918   BILITOT 0.6 07/24/2023 0918   BILITOT 0.6 04/22/2018 1624   GFRNONAA >60 07/24/2023 0918   GFRAA 93 04/22/2018 1624    Assessment: 1.  H. pylori gastritis: Patient has been through one 2-week treatment for H. pylori, but continues to be positive with epigastric pain, started her second round of treatment a couple of days ago, pain is since resolved, CT scan with thickening in the stomach likely related 2.  Abnormal CT of the abdomen and pelvis: Showing some inflamed lymph nodes, repeat CT in 3 months 3.  Hemorrhoids: Describes occasional bright red blood in her stool 4.  Screening for colorectal cancer: Patient is 66 and never had a colonoscopy, she is overdue for screening  Plan: 1.  At this point patient just restarted her second treatment for H. pylori found to be positive by her PCP, started on medications yesterday.  Encouraged her to finish these.  She will then need a retest for H. pylori via [employer] given that she is chronically on Omeprazole 40 twice daily 4 weeks after treatment. 2.  Patient also needs a repeat CT of the abdomen pelvis in 3 months to check on lymph nodes seen on last CT. 3.  Prescribed Zofran 4 mg Q 4-6 hours as needed for nausea.  #30 with 1 refill.  Patient tells me the medication she is on also makes her nauseous. 4.  Patient  will follow in clinic with me in 2 to 3  months.  At that time we will discuss a screening colonoscopy and possibly endoscopy pending on ongoing symptoms/results from [employer] testing.  Hyacinth Meeker, PA-C [facility] 08/02/2023, 9:52 AM  Cc: Randel Pigg, Dorma Russell, MD

## 2023-08-02 NOTE — Patient Instructions (Addendum)
Regrese a la oficina en 6 semanas (10/13/23) para recoger el kit de [place] [facility] [place].  Contine el tratamiento para H. Pylori.  Hemos enviado los siguientes medicamentos a su farmacia para que los recoja cuando le convenga: Zofran 4 mg cada 4-6 horas segn sea necesario.     Come back to the office in 6 weeks (09/13/23) to pick up stool sample kit.  Continue treatment for H. Pylori.  We have sent the following medications to your pharmacy for you to pick up at your convenience: Zofran 4 mg every 4-6 hours as needed.   Follow up in 3 months.

## 2023-08-06 NOTE — Progress Notes (Signed)
Attending Physician's Attestation   I have reviewed the chart.   I agree with the Advanced Practitioner's note, impression, and recommendations with any updates as below.    Gabriel Mansouraty, MD [facility] Advanced Endoscopy Office # [phone]  

## 2023-09-09 ENCOUNTER — Other Ambulatory Visit: Payer: Medicaid Other

## 2023-09-19 ENCOUNTER — Other Ambulatory Visit: Payer: Self-pay | Admitting: Physician Assistant

## 2023-09-19 NOTE — Progress Notes (Signed)
Lab Results:  [facility] testing show paitent is still positive for H.Pylori after her second round of quadruple therapy.  Also shows this is clarithromycin resistant.  Will discuss further with Dr. Meridee Score for recommendations.  Hyacinth Meeker, PA-C

## 2023-09-20 ENCOUNTER — Other Ambulatory Visit: Payer: Self-pay | Admitting: *Deleted

## 2023-09-23 ENCOUNTER — Other Ambulatory Visit: Payer: Self-pay | Admitting: *Deleted

## 2023-09-23 ENCOUNTER — Encounter: Payer: Self-pay | Admitting: *Deleted

## 2023-09-23 ENCOUNTER — Telehealth: Payer: Self-pay | Admitting: *Deleted

## 2023-09-23 DIAGNOSIS — B9681 Helicobacter pylori [H. pylori] as the cause of diseases classified elsewhere: Secondary | ICD-10-CM

## 2023-09-23 MED ORDER — TALICIA 250-12.5-10 MG PO CPDR: 4.0000 | DELAYED_RELEASE_CAPSULE | Freq: Three times a day (TID) | ORAL | 0 refills | Status: DC

## 2023-09-23 NOTE — Telephone Encounter (Signed)
Called patient to inform that she still has the H. Pylori infection. Interpreter called to translate information to patient. Patient was also informed that she will be taking the Talicia medication 4 capsules 3x's daily for 14 days. Patient was also informed after completing the medication, after 4 weeks, she will need to retake the stool test again and she will need to pick it up from the office on the second floor. Date given to pick up the stool test is December 3rd, which is 6 weeks after starting the medication, if started on 10/25/2023. Patient asked ''what will happen If it doesn't work.'' Informed the patient she will be referred to Infectious Disease. Also informed the patient to not worry about if it doesn't work, I want her to concentrate on taking the medication, but also wanted her to know that there is something else that can be done, another step to take. Patient understood and agreed. Medication sent to [facility] on [address] and [address].

## 2023-09-24 NOTE — Telephone Encounter (Signed)
Patients daughter called stating she had asked for the Omeprazole to be sent to [facility] on [address]. Also asked for a call back once done so she knows. Thank you

## 2023-09-25 ENCOUNTER — Telehealth: Payer: Self-pay | Admitting: Pharmacy Technician

## 2023-09-25 ENCOUNTER — Other Ambulatory Visit (HOSPITAL_COMMUNITY): Payer: Self-pay

## 2023-09-25 ENCOUNTER — Telehealth: Payer: Self-pay | Admitting: *Deleted

## 2023-09-25 NOTE — Telephone Encounter (Signed)
-----   Message from Nurse Alona Bene B sent at 09/25/2023 10:25 AM EDT ----- Sent a chat to [facility] in reference to this patient; who has tried several ABX therapies due to H. Pylori. None of them have worked and trying to find out if this medication will be or can be pre-authorized with Medicaid insurance, prior to sending that info to the ordering provider. Have not received any information from Kalub Morillo. So if anyone can answer this question, so I can let the patient know what will be the next step.   Thank you,  joyce

## 2023-09-25 NOTE — Telephone Encounter (Signed)
Called patient's daughter to inform, the Phoebe Perch medication has to be pre-authorized due to the [employer]. Pre-authorization notified and the process is presently pending. Patient's dauhgter also notified as soon as the process is complete, she will be notified. The daughter wanted to know how long it will take before the medication is authorized? Informed her, I do not know, but as soon as I find out, she will be notified. Daughter seemed satisfied with information given.

## 2023-09-25 NOTE — Telephone Encounter (Signed)
Patient came to the office to inform the nurse to send the medication to [facility] on [address] and [address]. Presently trying to obtain Pre-authorization for the Talicia due to the patient's Medicaid insurance. Patient was called today to inform of the situation with the use of an interpreter and informed the patient she would be notified as soon as I was informed what the outcome would be concerning the medication authorization. Patient agreed.

## 2023-09-25 NOTE — Telephone Encounter (Signed)
Inbound call from patient, following up on call below. Would like Talicia medication sent to [facility] on [address].

## 2023-09-25 NOTE — Telephone Encounter (Signed)
PA is required, and is in process. Have tried pt last name different ways to get it to process through CoverMyMeds. Status is still pending.

## 2023-09-25 NOTE — Telephone Encounter (Signed)
error 

## 2023-09-26 ENCOUNTER — Ambulatory Visit (INDEPENDENT_AMBULATORY_CARE_PROVIDER_SITE_OTHER): Payer: Medicaid Other | Admitting: Obstetrics and Gynecology

## 2023-09-26 ENCOUNTER — Other Ambulatory Visit (HOSPITAL_COMMUNITY): Payer: Self-pay

## 2023-09-26 ENCOUNTER — Telehealth: Payer: Self-pay | Admitting: *Deleted

## 2023-09-26 ENCOUNTER — Encounter: Payer: Self-pay | Admitting: Obstetrics and Gynecology

## 2023-09-26 VITALS — BP 116/80 | Ht 60.0 in | Wt 154.0 lb

## 2023-09-26 DIAGNOSIS — Z01419 Encounter for gynecological examination (general) (routine) without abnormal findings: Secondary | ICD-10-CM | POA: Insufficient documentation

## 2023-09-26 DIAGNOSIS — A609 Anogenital herpesviral infection, unspecified: Secondary | ICD-10-CM

## 2023-09-26 DIAGNOSIS — M810 Age-related osteoporosis without current pathological fracture: Secondary | ICD-10-CM | POA: Diagnosis not present

## 2023-09-26 DIAGNOSIS — M816 Localized osteoporosis [Lequesne]: Secondary | ICD-10-CM | POA: Insufficient documentation

## 2023-09-26 NOTE — Telephone Encounter (Signed)
This encounter has already been addressed

## 2023-09-26 NOTE — Telephone Encounter (Signed)
Was informed the patient was denied the Talicia medication via the RX Pre-authorization team.

## 2023-09-26 NOTE — Assessment & Plan Note (Addendum)
Cervical cancer screening performed according to ASCCP guidelines. Encouraged annual mammogram screening Colonoscopy, to f/u with GI DXA 09/25/23 Labs and immunizations with her primary Encouraged safe sexual practices as indicated Encouraged healthy lifestyle practices with diet and exercise For patients under 50-68yo, I recommend 1200mg  calcium daily and 600IU of vitamin D daily.

## 2023-09-26 NOTE — Telephone Encounter (Signed)
-----   Message from Unk Lightning sent at 09/20/2023  5:49 AM EDT ----- Regarding: FW: Clarithromycin resistant H. pylori Please let patient know unfortunately still H Pylori positive- see recommendations below.  Thanks-JLL ----- Message ----- From: Lemar Lofty., MD Sent: 09/20/2023   4:13 AM EDT To: Unk Lightning, PA Subject: RE: Clarithromycin resistant H. pylori         JLL, Treat her with Phoebe Perch. Repeat Diatherix 1 month after treatment. If still positive send 2 infectious disease. Thanks. GM ----- Message ----- From: Unk Lightning, PA Sent: 09/19/2023   8:33 AM EDT To: Lemar Lofty., MD Subject: Clarithromycin resistant H. pylori             Just got this patient's Diatherix testing back she is still positive for H. pylori, has been treated with 2 rounds of quadruple therapy by her PCP.  Also shows it is clarithromycin resistant.  What would be your recommendations next?  Thanks, JL L

## 2023-09-26 NOTE — Progress Notes (Signed)
67 y.o. G86P7 postmenopausal female with osteoporosis, prior TAH here for new GYN visit. Presents with her daughter. Widowed. Interpreter, Chyrl Civatte, was used the visit. Patient had annual exam in July 2024 with OBGYN (see care everywhere)  They are presenting to discuss recent PAP and hx HSV. 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 She had a subsequent hysterectomy and was followed with annual PAPs. She denies abnormal PAP following hysterectomy.  Postmenopausal bleeding: none Pelvic discharge or pain: none Breast mass, nipple discharge or skin changes : none Last PAP: July 2024 HPV neg, cytology unsatisfactory Last mammogram: 2-3y ago, scheduled for next week Last colonoscopy: due, follows with GI, next appt 10/21/23 Last DXA: 10/232024, spine T-2.6, L femur -1.3, L hip -0.2, L radius -3.4 Sexually active: no  Exercising: walks daily x1 hour  GYN HISTORY: Hysterectomy for some type of reproductive cancer, believes stage 1 cancer  OB History  Gravida Para Term Preterm AB Living  7 7       7   SAB IAB Ectopic Multiple Live Births               # Outcome Date GA Lbr Len/2nd Weight Sex Type Anes PTL Lv  7 Para           6 Para           5 Para           4 Para           3 Para           2 Para           1 Para             Past Medical History:  Diagnosis Date   Complication of anesthesia    "air escaped from hole in my nose and was in my face"   Depression    Gastritis    H. pylori infection    IBS (irritable bowel syndrome)     Past Surgical History:  Procedure Laterality Date   ABDOMINAL HYSTERECTOMY     67yo   BLADDER SURGERY     CHOLECYSTECTOMY     ESOPHAGOGASTRODUODENOSCOPY N/A 09/01/2018   Procedure: ESOPHAGOGASTRODUODENOSCOPY (EGD);  Surgeon: Lemar Lofty., MD;  Location: [facility] ENDOSCOPY;  Service: Gastroenterology;  Laterality: N/A;   EUS N/A 09/01/2018   Procedure: UPPER ENDOSCOPIC ULTRASOUND (EUS) LINEAR;   Surgeon: Lemar Lofty., MD;  Location: [facility] ENDOSCOPY;  Service: Gastroenterology;  Laterality: N/A;   THROAT SURGERY      Current Outpatient Medications on File Prior to Visit  Medication Sig Dispense Refill   omeprazole (PRILOSEC) 40 MG capsule Take 1 capsule (40 mg total) by mouth daily. 30 capsule 0   No current facility-administered medications on file prior to visit.    Social History   Socioeconomic History   Marital status: Widowed    Spouse name: Not on file   Number of children: 7   Years of education: Not on file   Highest education level: Not on file  Occupational History   Occupation: retired  Tobacco Use   Smoking status: Never    Passive exposure: Never   Smokeless tobacco: Never  Vaping Use   Vaping status: Never Used  Substance and Sexual Activity   Alcohol use: No    Alcohol/week: 0.0 standard drinks of alcohol   Drug use: Not Currently   Sexual activity: Yes    Partners: Male  Birth control/protection: Post-menopausal, Surgical    Comment: Hysterectomy  Other Topics Concern   Not on file  Social History Narrative   ** Merged History Encounter **       Social Determinants of Health   Financial Resource Strain: Not on file  Food Insecurity: Not on file  Transportation Needs: Not on file  Physical Activity: Not on file  Stress: Not on file  Social Connections: Not on file  Intimate Partner Violence: Not on file    Family History  Problem Relation Age of Onset   Heart disease Mother    Healthy Father    Liver disease Neg Hx    Esophageal cancer Neg Hx    Colon cancer Neg Hx     Allergies  Allergen Reactions   Dipyrone Palpitations      PE Today's Vitals   09/26/23 1055  BP: 116/80  Weight: 154 lb (69.9 kg)  Height: 5' (1.524 m)   Body mass index is 30.08 kg/m.  Physical Exam Vitals reviewed. Exam conducted with a chaperone present.  Constitutional:      General: She is not in acute distress.    Appearance:  Normal appearance.  HENT:     Head: Normocephalic and atraumatic.     Nose: Nose normal.  Eyes:     Extraocular Movements: Extraocular movements intact.     Conjunctiva/sclera: Conjunctivae normal.  Neck:     Thyroid: No thyroid mass, thyromegaly or thyroid tenderness.  Pulmonary:     Effort: Pulmonary effort is normal.  Chest:     Chest wall: No mass or tenderness.  Breasts:    Right: Normal. No swelling, mass, nipple discharge or tenderness.     Left: Normal. No swelling, mass, nipple discharge or tenderness.  Abdominal:     General: There is no distension.     Palpations: Abdomen is soft.     Tenderness: There is no abdominal tenderness.  Genitourinary:    General: Normal vulva.     Exam position: Lithotomy position.     Urethra: No prolapse.     Vagina: Normal. No vaginal discharge or bleeding.     Cervix: No lesion.     Adnexa: Right adnexa normal and left adnexa normal.     Comments: Cervix and uterus absent Musculoskeletal:        General: Normal range of motion.     Cervical back: Normal range of motion.  Lymphadenopathy:     Upper Body:     Right upper body: No axillary adenopathy.     Left upper body: No axillary adenopathy.     Lower Body: No right inguinal adenopathy. No left inguinal adenopathy.  Skin:    General: Skin is warm and dry.  Neurological:     General: No focal deficit present.     Mental Status: She is alert.  Psychiatric:        Mood and Affect: Mood normal.        Behavior: Behavior normal.       Assessment and Plan:        Osteoporosis of multiple sites without pathological fracture Assessment & Plan: Bone density report discussed. Severe osteoporosis noted at radius. Risk factors assessed: Menopause. No history of fracture. Counseled regarding osteoporosis, risk factors, treatment options. Discussed importance of weight bearing exercise, daily calcium 1200 mg daily and 600  vit D, and fall risk reduction. Will check labs for  secondary causes of osteoporosis. Referral to endocrine sent given T score <-3.0  Patient does understand that osteoporosis is a silent disease until fracture occurs and understands that there is significant morbidity associated with osteoporotic fracture.     Orders: -     Sodium,24 hour Urine with Creatinine -     Calcium, urine, 24 hour -     Phosphorus -     Magnesium -     COMPLETE METABOLIC PANEL WITH GFR -     PTH, intact and calcium -     VITAMIN D 25 Hydroxy (Vit-D Deficiency, Fractures) -     TSH -     Ambulatory referral to Endocrinology  HSV (herpes simplex virus) anogenital infection -     HSV(herpes simplex vrs) 1+2 ab-IgG   RTO for annual exam in July Rosalyn Gess, MD

## 2023-09-26 NOTE — Patient Instructions (Signed)
I recommend 1200mg  calcium daily and 600IU of vitamin D daily.

## 2023-09-26 NOTE — Telephone Encounter (Signed)
Pharmacy Patient Advocate Encounter  Received notification from [facility] that Prior Authorization for Casey Brewer has been DENIED.  Full denial letter will be uploaded to the media tab. See denial reason below.    PA #/Case ID/Reference #: [id number]   Appeal has been submitted. Will advise when response is received or follow up in 1 week. Please be advised that most companies may take 30 days to make a decision.  Pt has tried all components of Pylera (Metronidazole, Amoxicillin, and Bismuth)

## 2023-09-26 NOTE — Assessment & Plan Note (Addendum)
Bone density report discussed. Severe osteoporosis noted at radius. Risk factors assessed: Menopause. No history of fracture. Counseled regarding osteoporosis, risk factors, treatment options. Discussed importance of weight bearing exercise, daily calcium 1200 mg daily and 600  vit D, and fall risk reduction. Will check labs for secondary causes of osteoporosis. Referral to endocrine sent given T score <-3.0 Patient does understand that osteoporosis is a silent disease until fracture occurs and understands that there is significant morbidity associated with osteoporotic fracture.

## 2023-09-27 ENCOUNTER — Other Ambulatory Visit: Payer: Self-pay | Admitting: *Deleted

## 2023-09-27 ENCOUNTER — Other Ambulatory Visit (HOSPITAL_COMMUNITY): Payer: Self-pay

## 2023-09-27 DIAGNOSIS — B9681 Helicobacter pylori [H. pylori] as the cause of diseases classified elsewhere: Secondary | ICD-10-CM

## 2023-09-27 LAB — COMPLETE METABOLIC PANEL WITH GFR
AG Ratio: 1.1 (calc) (ref 1.0–2.5)
ALT: 12 U/L (ref 6–29)
AST: 18 U/L (ref 10–35)
Albumin: 4 g/dL (ref 3.6–5.1)
Alkaline phosphatase (APISO): 74 U/L (ref 37–153)
BUN: 11 mg/dL (ref 7–25)
CO2: 25 mmol/L (ref 20–32)
Calcium: 9.6 mg/dL (ref 8.6–10.4)
Chloride: 105 mmol/L (ref 98–110)
Creat: 0.76 mg/dL (ref 0.50–1.05)
Globulin: 3.5 g/dL (ref 1.9–3.7)
Glucose, Bld: 83 mg/dL (ref 65–99)
Potassium: 4.2 mmol/L (ref 3.5–5.3)
Sodium: 140 mmol/L (ref 135–146)
Total Bilirubin: 1 mg/dL (ref 0.2–1.2)
Total Protein: 7.5 g/dL (ref 6.1–8.1)
eGFR: 85 mL/min/{1.73_m2} (ref >=60)

## 2023-09-27 LAB — PTH, INTACT AND CALCIUM
Calcium: 9.4 mg/dL (ref 8.6–10.4)
PTH: 39 pg/mL (ref 16–77)

## 2023-09-27 LAB — HSV(HERPES SIMPLEX VRS) I + II AB-IGG
HSV 1 IGG,TYPE SPECIFIC AB: 42.8 {index} — ABNORMAL HIGH
HSV 2 IGG,TYPE SPECIFIC AB: 13.2 {index} — ABNORMAL HIGH

## 2023-09-27 LAB — TSH: TSH: 2.03 m[IU]/L (ref 0.40–4.50)

## 2023-09-27 LAB — MAGNESIUM: Magnesium: 2.2 mg/dL (ref 1.5–2.5)

## 2023-09-27 LAB — VITAMIN D 25 HYDROXY (VIT D DEFICIENCY, FRACTURES): Vit D, 25-Hydroxy: 31 ng/mL (ref 30–100)

## 2023-09-27 LAB — PHOSPHORUS: Phosphorus: 3.9 mg/dL (ref 2.1–4.3)

## 2023-09-27 NOTE — Telephone Encounter (Signed)
Ms. Casey Brewer, [place] notified and nurse informed that all elements of  Pylera medications have been tried and to follow through with infectious disease referral. Infectious Disease referral ordered. Called patient with the assistance of a Spanish interpreter to inform the Talicia medication has been denied and infectious Disease referral is in the process. Also notified the patient that Infectious Disease will be calling to make appt with her. Patient understood and agreed.

## 2023-09-27 NOTE — Telephone Encounter (Signed)
Referral for Infectious Disease made today, patient notified that someone will be calling her for an appt. Patient understood and agreed. Interpreter assisted.

## 2023-09-27 NOTE — Telephone Encounter (Signed)
Pharmacy Patient Advocate Encounter  Received notification from [facility] that Appeal for [name] has been DENIED.  Boneta Lucks from the [facility] department called for additional information on this patient. After a second call back they decided to uphold the decision to deny Casey Brewer due to patient must try Pylera (BRAND). Although patient has had them individually, they will not pay for the Casey Brewer until patient has tried [employer].   Insurance will cover [employer] without a PA

## 2023-09-27 NOTE — Telephone Encounter (Signed)
Called patient and notified of referral to Infectious Disease, informed they will be calling for an appt. Patient understood and agreed. Interpreter's assistance  required.

## 2023-10-02 ENCOUNTER — Other Ambulatory Visit: Payer: Medicaid Other

## 2023-10-02 DIAGNOSIS — M810 Age-related osteoporosis without current pathological fracture: Secondary | ICD-10-CM

## 2023-10-03 ENCOUNTER — Other Ambulatory Visit: Payer: Medicaid Other

## 2023-10-03 DIAGNOSIS — M810 Age-related osteoporosis without current pathological fracture: Secondary | ICD-10-CM

## 2023-10-04 LAB — SODIUM,24 HOUR URINE WITH CREATININE
Creatinine, 24H Ur: 1.11 g/(24.h) (ref 0.50–2.15)
Sodium, 24H Ur: 64 mmol/(24.h) (ref 52–380)
Sodium/Creat Ratio: 58 mmol/g{creat} (ref 50–230)

## 2023-10-04 LAB — CALCIUM, URINE, 24 HOUR: Calcium, 24H Urine: 105 mg/(24.h)

## 2023-10-10 NOTE — Telephone Encounter (Signed)
 Patient and patients daughter stopped by the clinic to ask about calling pharmacy because patient is having an issue with getting her prolia shot and the cost of it.  Pt daughter states that the pharmacy is needing more information from the dr office. I asked her if she knew if it was a prior authorization and she was not sure.  Can you please call the daughter back at [phone]. Please call with a spanish interpreter.

## 2023-10-11 ENCOUNTER — Encounter: Payer: Self-pay | Admitting: Physician Assistant

## 2023-10-11 NOTE — Telephone Encounter (Signed)
 Pt needed a prior auth per pharmacy. Working on it through [employer].

## 2023-10-14 MED ORDER — VALACYCLOVIR HCL 500 MG PO TABS
500.0000 mg | ORAL_TABLET | Freq: Every day | ORAL | 3 refills | Status: DC
Start: 2023-10-14 — End: 2024-01-28

## 2023-10-14 NOTE — Telephone Encounter (Signed)
 Insurance denied her Prolia. May need to try something else first

## 2023-10-14 NOTE — Addendum Note (Signed)
Addended by: Darrell Jewel V on: 10/14/2023 05:18 PM   Modules accepted: Orders

## 2023-10-15 ENCOUNTER — Other Ambulatory Visit (HOSPITAL_COMMUNITY): Payer: Self-pay

## 2023-10-15 ENCOUNTER — Ambulatory Visit (INDEPENDENT_AMBULATORY_CARE_PROVIDER_SITE_OTHER): Payer: Medicaid Other | Admitting: Internal Medicine

## 2023-10-15 ENCOUNTER — Encounter: Payer: Self-pay | Admitting: Internal Medicine

## 2023-10-15 ENCOUNTER — Other Ambulatory Visit: Payer: Self-pay

## 2023-10-15 VITALS — BP 115/73 | HR 55 | Temp 98.5°F | Resp 16 | Wt 152.6 lb

## 2023-10-15 DIAGNOSIS — A048 Other specified bacterial intestinal infections: Secondary | ICD-10-CM | POA: Diagnosis present

## 2023-10-15 MED ORDER — RIFABUTIN 150 MG PO CAPS: 300.0000 mg | ORAL_CAPSULE | Freq: Every day | ORAL | 0 refills | Status: DC

## 2023-10-15 MED ORDER — AMOXICILLIN 500 MG PO TABS: 1000.0000 mg | ORAL_TABLET | Freq: Three times a day (TID) | ORAL | 0 refills | Status: DC

## 2023-10-15 NOTE — Patient Instructions (Signed)
Siga tomando omeprazole dos veces al dia y empieza rifabutin 300 mg una vez diario y amoxicillina 2 pastillas tres veces al dia  Dejarnos una muestra de heces mas of menos a partir de 10 de [place]

## 2023-10-15 NOTE — Progress Notes (Signed)
[facility]      Reason for Consult: H pylori infection     Referring Physician: Sinda Du PA    Patient ID: Casey Brewer, female    DOB: August 09, 1956, 67 y.o.   MRN: [id number]  HPI:   [name] is here for evaluation of H pylori.  She has a long history of early satiety for several years and recently seen by her PCP for epigastric pain and diagnosed with H pylori infection by stool Ag.  Started on first line therapy and tried twice and referred to GI.  Sent to [facility] lab and found to have Clarithromycin resistance.  She was then prescribed rifabutin=based treatment but did not start with insurance denial.  She continues to complain of the same issues.  She did have some problem with nausea on the previous regimen but was able to take completely.  She takes omeprazole now.     Past Medical History:  Diagnosis Date   Complication of anesthesia    "air escaped from hole in my nose and was in my face"   Depression    Gastritis    H. pylori infection    IBS (irritable bowel syndrome)     Prior to Admission medications   Medication Sig Start Date End Date Taking? Authorizing Provider  omeprazole (PRILOSEC) 40 MG capsule Take 1 capsule (40 mg total) by mouth daily. 07/24/23  Yes Arby Barrette, MD  propranolol (INDERAL) 10 MG tablet Take by mouth. 10/09/23  Yes [provider]  valACYclovir (VALTREX) 500 MG tablet Take 1 tablet (500 mg total) by mouth daily. 10/14/23  Yes Hines, Lennox Solders, MD    Allergies  Allergen Reactions   Dipyrone Palpitations    Social History   Tobacco Use   Smoking status: Never    Passive exposure: Never   Smokeless tobacco: Never  Vaping Use   Vaping status: Never Used  Substance Use Topics   Alcohol use: No    Alcohol/week: 0.0 standard drinks of alcohol   Drug use: Not Currently    Family History  Problem Relation Age of Onset   Heart disease Mother    Healthy Father    Liver disease Neg Hx     Esophageal cancer Neg Hx    Colon cancer Neg Hx     Review of Systems  Constitutional: negative for fatigue All other systems reviewed and are negative    Constitutional: in no apparent distress There were no vitals filed for this visit. EYES: anicteric Respiratory: normal respiratory effort GI: soft   Labs: Lab Results  Component Value Date   WBC 8.5 07/24/2023   HGB 13.9 07/24/2023   HCT 42.2 07/24/2023   MCV 88.5 07/24/2023   PLT 339 07/24/2023    Lab Results  Component Value Date   CREATININE 0.76 09/26/2023   BUN 11 09/26/2023   NA 140 09/26/2023   K 4.2 09/26/2023   CL 105 09/26/2023   CO2 25 09/26/2023    Lab Results  Component Value Date   ALT 12 09/26/2023   AST 18 09/26/2023   ALKPHOS 55 07/24/2023   BILITOT 1.0 09/26/2023     Assessment: H pylori infection with clarithromycin resistance.   Discussed treatment options and will try rifabutin + amoxicillin + omeprazole with an individual prescription for each.   Will take for 2 weeks then stop all including stopping omeprazole  Repeat stool Ag 2 weeks after stopping medications (about December 12)  Plan:  1)  rifabutin-based treatment 2) repeat testing 2 weeks after completion Follow up again if positive

## 2023-10-21 ENCOUNTER — Encounter: Payer: Self-pay | Admitting: Physician Assistant

## 2023-10-21 ENCOUNTER — Telehealth: Payer: Self-pay

## 2023-10-21 ENCOUNTER — Ambulatory Visit (INDEPENDENT_AMBULATORY_CARE_PROVIDER_SITE_OTHER): Payer: Medicaid Other | Admitting: Physician Assistant

## 2023-10-21 VITALS — BP 118/66 | HR 61 | Ht 60.0 in | Wt 155.4 lb

## 2023-10-21 DIAGNOSIS — R1013 Epigastric pain: Secondary | ICD-10-CM

## 2023-10-21 DIAGNOSIS — R935 Abnormal findings on diagnostic imaging of other abdominal regions, including retroperitoneum: Secondary | ICD-10-CM | POA: Diagnosis not present

## 2023-10-21 DIAGNOSIS — B9681 Helicobacter pylori [H. pylori] as the cause of diseases classified elsewhere: Secondary | ICD-10-CM | POA: Diagnosis not present

## 2023-10-21 DIAGNOSIS — R1011 Right upper quadrant pain: Secondary | ICD-10-CM | POA: Diagnosis not present

## 2023-10-21 DIAGNOSIS — K649 Unspecified hemorrhoids: Secondary | ICD-10-CM

## 2023-10-21 DIAGNOSIS — K2970 Gastritis, unspecified, without bleeding: Secondary | ICD-10-CM

## 2023-10-21 DIAGNOSIS — K648 Other hemorrhoids: Secondary | ICD-10-CM

## 2023-10-21 NOTE — Patient Instructions (Addendum)
Seguimiento el 01/14/24 a las 10:30 am con Hyacinth Meeker PA-C     _______________________________________________________  If your blood pressure at your visit was 140/90 or greater, please contact your primary care physician to follow up on this.  _______________________________________________________  If you are age 68 or older, your body mass index should be between 23-30. Your Body mass index is 30.34 kg/m. If this is out of the aforementioned range listed, please consider follow up with your Primary Care Provider.  If you are age 19 or younger, your body mass index should be between 19-25. Your Body mass index is 30.34 kg/m. If this is out of the aformentioned range listed, please consider follow up with your Primary Care Provider.   ________________________________________________________  The [facility] providers would like to encourage you to use [facility] to communicate with providers for non-urgent requests or questions.  Due to long hold times on the telephone, sending your provider a message by [facility] may be a faster and more efficient way to get a response.  Please allow 48 business hours for a response.  Please remember that this is for non-urgent requests.  _______________________________________________________  Thank you for choosing me and [facility].  Hyacinth Meeker PA-C

## 2023-10-21 NOTE — Progress Notes (Signed)
Attending Physician's Attestation   I have reviewed the chart.   I agree with the Advanced Practitioner's note, impression, and recommendations with any updates as below. Would obtain H. pylori Diatherix testing when she returns after completion of her treatment. Then EGD/colonoscopy for further workup is reasonable. Agree with repeat imaging for lymph node status or other workup as per primary care.   Corliss Parish, MD [facility] Advanced Endoscopy Office # [phone]

## 2023-10-21 NOTE — Progress Notes (Signed)
Chief Complaint: Follow-up abdominal pain  HPI:    Casey Brewer is a 67 year old Hispanic female, previously known to Dr. Perry Mount assigned to Dr. Meridee Score at recent hospitalization, with a past medical history as listed below, who returns to clinic today for follow-up of her abdominal pain and H. pylori.    09/01/2018 EUS for common bile duct dilation-at the time EGD findings with potential developing traction diverticulum at the right side of the UES, medium sized hiatal hernia, EUS with no sign of significant pathology in the common bile duct.    07/24/2023 patient seen early in the urgent care for right upper quadrant pain and nausea for 2 days.  Discussed a history of H. pylori which was treated a month ago with resolution of symptoms.  She was sent to the ED for imaging.    07/24/2023 ER visit for continued right upper quadrant pain.  Had finished 2 weeks of medications for H. pylori.  Associated symptoms include nausea.  Discussed previous cholecystectomy.  Urinalysis, CMP, lipase and CBC normal.  CTAP with contrast showed normal appendix, scattered colonic stool, no obstruction or free air.  Previous cholecystectomy, prominent/borderline enlarged right lower quadrant mesenteric lymph nodes of uncertain etiology and significance.  Recommended correlation with any known history of prior imaging of the area or dedicated follow-up in 3 months.  Also slight fold thickening of the stomach.  That time discussed that she had run out of Omeprazole recently.  She was restarted on this and Carafate and Pepcid.    07/30/2023 patient had H. pylori stool antigen testing which was positive.  Patient restarted on Prilosec, Levaquin and Amoxicillin.    08/02/2023 patient seen in clinic and explained having severe epigastric/right upper quadrant pain when she went to the ER but since restarting all meds for H. pylori she had no further pain.  Discussed CT with question of ulcer and colonoscopy.  That time recommended  she finish her second treatment for H. pylori.  She would need Diatherix testing 4 weeks after.  Also repeat CT the abdomen pelvis in 3 months to check on lymph nodes seen on CT.  Prescribe Zofran.  Explained that she would follow me in 2 to 3 months and we can discuss a screening colonoscopy and possibly endoscopy pending and ongoing symptoms.    10/15/2023 H. pylori antigen in the stool was positive.    10/15/2023 patient saw infectious disease and was told to use Rifabutin based treatment for H. pylori.    Today, patient presents to clinic accompanied by an interpreter and her daughter.  They explained that she just went to see infectious disease and is currently taking Rifabutin for her H. pylori.  Patient tells me she always has some discomfort in her epigastrium/right upper quadrant but it seems like when she is actually taking the antibiotic or shortly thereafter she has an increased discomfort in the side.  No nausea or vomiting.  Asked about when we can schedule her procedures.    She is going back to [place] from the middle of December till January.    Denies fever, chills, weight loss or blood in her stool.  Past Medical History:  Diagnosis Date   Complication of anesthesia    "air escaped from hole in my nose and was in my face"   Depression    Gastritis    H. pylori infection    IBS (irritable bowel syndrome)     Past Surgical History:  Procedure Laterality Date   ABDOMINAL  HYSTERECTOMY     67yo   BLADDER SURGERY     CHOLECYSTECTOMY     ESOPHAGOGASTRODUODENOSCOPY N/A 09/01/2018   Procedure: ESOPHAGOGASTRODUODENOSCOPY (EGD);  Surgeon: Lemar Lofty., MD;  Location: [facility] ENDOSCOPY;  Service: Gastroenterology;  Laterality: N/A;   EUS N/A 09/01/2018   Procedure: UPPER ENDOSCOPIC ULTRASOUND (EUS) LINEAR;  Surgeon: Lemar Lofty., MD;  Location: [facility] ENDOSCOPY;  Service: Gastroenterology;  Laterality: N/A;   THROAT SURGERY      Current Outpatient Medications   Medication Sig Dispense Refill   amoxicillin (AMOXIL) 500 MG tablet Take 2 tablets (1,000 mg total) by mouth 3 (three) times daily. 84 tablet 0   omeprazole (PRILOSEC) 40 MG capsule Take 1 capsule (40 mg total) by mouth daily. 30 capsule 0   propranolol (INDERAL) 10 MG tablet Take by mouth.     rifabutin (MYCOBUTIN) 150 MG capsule Take 2 capsules (300 mg total) by mouth daily. 28 capsule 0   valACYclovir (VALTREX) 500 MG tablet Take 1 tablet (500 mg total) by mouth daily. 90 tablet 3   No current facility-administered medications for this visit.    Allergies as of 10/21/2023 - Review Complete 10/21/2023  Allergen Reaction Noted   Dipyrone Palpitations 07/31/2018    Family History  Problem Relation Age of Onset   Heart disease Mother    Healthy Father    Liver disease Neg Hx    Esophageal cancer Neg Hx    Colon cancer Neg Hx     Social History   Socioeconomic History   Marital status: Widowed    Spouse name: Not on file   Number of children: 7   Years of education: Not on file   Highest education level: Not on file  Occupational History   Occupation: retired  Tobacco Use   Smoking status: Never    Passive exposure: Never   Smokeless tobacco: Never  Vaping Use   Vaping status: Never Used  Substance and Sexual Activity   Alcohol use: No    Alcohol/week: 0.0 standard drinks of alcohol   Drug use: Not Currently   Sexual activity: Yes    Partners: Male    Birth control/protection: Post-menopausal, Surgical    Comment: Hysterectomy  Other Topics Concern   Not on file  Social History Narrative   ** Merged History Encounter **       Social Determinants of Health   Financial Resource Strain: Not on file  Food Insecurity: Not on file  Transportation Needs: Not on file  Physical Activity: Not on file  Stress: Not on file  Social Connections: Not on file  Intimate Partner Violence: Not on file    Review of Systems:    Constitutional: No weight loss, fever or  chills  Cardiovascular: No chest pain Respiratory: No SOB Gastrointestinal: See HPI and otherwise negative   Physical Exam:  Vital signs: BP 118/66   Pulse 61   Ht 5' (1.524 m) Comment: measured in office  Wt 155 lb 6 oz (70.5 kg)   BMI 30.34 kg/m    Constitutional:   Pleasant Hispanic female appears to be in NAD, Well developed, Well nourished, alert and cooperative  Respiratory: Respirations even and unlabored. Lungs clear to auscultation bilaterally.   No wheezes, crackles, or rhonchi.  Cardiovascular: Normal S1, S2. No MRG. Regular rate and rhythm. No peripheral edema, cyanosis or pallor.  Gastrointestinal:  Soft, nondistended, mild epigastric/RUQ ttp, No rebound or guarding. Normal bowel sounds. No appreciable masses or hepatomegaly. Rectal:  Not performed. Marland Kitchen  Psychiatric: Demonstrates good judgement and reason without abnormal affect or behaviors.  RELEVANT LABS AND IMAGING: CBC    Component Value Date/Time   WBC 8.5 07/24/2023 0918   RBC 4.77 07/24/2023 0918   HGB 13.9 07/24/2023 0918   HGB 14.0 04/22/2018 1624   HCT 42.2 07/24/2023 0918   HCT 41.3 04/22/2018 1624   PLT 339 07/24/2023 0918   PLT 301 04/22/2018 1624   MCV 88.5 07/24/2023 0918   MCV 85 04/22/2018 1624   MCH 29.1 07/24/2023 0918   MCHC 32.9 07/24/2023 0918   RDW 13.6 07/24/2023 0918   RDW 14.4 04/22/2018 1624   LYMPHSABS 2.2 07/24/2023 0918   LYMPHSABS 2.9 04/22/2018 1624   MONOABS 0.6 07/24/2023 0918   EOSABS 0.4 07/24/2023 0918   EOSABS 0.3 04/22/2018 1624   BASOSABS 0.1 07/24/2023 0918   BASOSABS 0.1 04/22/2018 1624    CMP     Component Value Date/Time   NA 140 09/26/2023 1147   NA 141 04/22/2018 1624   K 4.2 09/26/2023 1147   CL 105 09/26/2023 1147   CO2 25 09/26/2023 1147   GLUCOSE 83 09/26/2023 1147   BUN 11 09/26/2023 1147   BUN 12 04/22/2018 1624   CREATININE 0.76 09/26/2023 1147   CALCIUM 9.6 09/26/2023 1147   CALCIUM 9.4 09/26/2023 1147   PROT 7.5 09/26/2023 1147   PROT 7.2  04/22/2018 1624   ALBUMIN 3.9 07/24/2023 0918   ALBUMIN 4.4 04/22/2018 1624   AST 18 09/26/2023 1147   ALT 12 09/26/2023 1147   ALKPHOS 55 07/24/2023 0918   BILITOT 1.0 09/26/2023 1147   BILITOT 0.6 04/22/2018 1624   GFRNONAA >60 07/24/2023 0918   GFRAA 93 04/22/2018 1624    Assessment: 1.  H. pylori gastritis: Patient has completed 2 rounds of multiple antibiotics for H. pylori, now following with infectious disease on Rifabutin, continues with some epigastric/right upper quadrant TTP 2.  Abnormal CT the abdomen pelvis: With some inflamed lymph nodes, uncertain etiology 3.  Hemorrhoids: Describes occasional bright red blood in her stool, nothing recently  Plan: 1.  Will discuss repeat CT of the abdomen pelvis when she returns from [place] for follow-up of inflamed lymph nodes. 2.  Patient will follow-up with [place] in February when she returns and we can discuss EGD and colonoscopy.  Hopefully by then her H. pylori will be treated successfully. 3.  For now continue to follow with infectious disease and continue Rifabutin 4.  Patient to follow-up with [place] in February as above.  Hyacinth Meeker, PA-C [facility] 10/21/2023, 10:16 AM  Cc: Randel Pigg, Dorma Russell, MD

## 2023-10-21 NOTE — Telephone Encounter (Signed)
She will definitely experience more pronounced side effects today from taking triple the normal dose - possible nausea and diarrhea, etc. Probably fine to be two days short - will have Dr. Luciana Axe weigh in.

## 2023-10-21 NOTE — Telephone Encounter (Signed)
Patient's daughter called stating the patient accidentally took 6 capsules of the rifabutin when she started the treatment instead of only 2 capsules.  Patient daughter concerned patient will be short 4 capsules.  Please advise Jacara Benito Jonathon Resides, CMA

## 2023-10-22 ENCOUNTER — Other Ambulatory Visit: Payer: Self-pay | Admitting: Internal Medicine

## 2023-10-22 MED ORDER — RIFABUTIN 150 MG PO CAPS: 300.0000 mg | ORAL_CAPSULE | Freq: Every day | ORAL | 0 refills | Status: DC

## 2023-10-22 NOTE — Telephone Encounter (Signed)
Spoke to patient's daughter and advised her that 4 additional capsules have been sent to the pharmacy.  Iriel Nason Jonathon Resides, CMA

## 2023-10-28 DIAGNOSIS — R251 Tremor, unspecified: Secondary | ICD-10-CM | POA: Insufficient documentation

## 2023-11-05 ENCOUNTER — Telehealth: Payer: Self-pay | Admitting: *Deleted

## 2023-11-05 NOTE — Telephone Encounter (Signed)
This encounter has been addressed. Patient has been referred to infectious disease on 10/25024.

## 2023-11-15 ENCOUNTER — Other Ambulatory Visit: Payer: Self-pay

## 2023-11-15 ENCOUNTER — Other Ambulatory Visit: Payer: Medicaid Other

## 2023-11-15 DIAGNOSIS — A048 Other specified bacterial intestinal infections: Secondary | ICD-10-CM

## 2023-11-18 LAB — HELICOBACTER PYLORI  SPECIAL ANTIGEN
MICRO NUMBER:: 15850272
SPECIMEN QUALITY: ADEQUATE

## 2023-11-20 ENCOUNTER — Telehealth: Payer: Self-pay

## 2023-11-20 NOTE — Telephone Encounter (Signed)
Called and spoke with patient regarding results. Did not have any questions at this time. Understands she can follow up as needed. Interpreter: Blossom Hoops [id number] Juanita Laster, RMA

## 2023-11-20 NOTE — Telephone Encounter (Signed)
-----   Message from Belia Heman Comer sent at 11/20/2023 10:15 AM EST ----- Please let her know her H pylori test is negative after completing her treatment.  No follow up needed.  thanks

## 2023-12-19 ENCOUNTER — Encounter: Payer: Self-pay | Admitting: Emergency Medicine

## 2023-12-19 ENCOUNTER — Ambulatory Visit
Admission: EM | Admit: 2023-12-19 | Discharge: 2023-12-19 | Disposition: A | Discharge status: Home / Self Care | Payer: Medicaid Other | Attending: Family Medicine | Admitting: Family Medicine

## 2023-12-19 DIAGNOSIS — J111 Influenza due to unidentified influenza virus with other respiratory manifestations: Secondary | ICD-10-CM

## 2023-12-19 DIAGNOSIS — R051 Acute cough: Secondary | ICD-10-CM | POA: Diagnosis not present

## 2023-12-19 LAB — POC COVID19/FLU A&B COMBO
Covid Antigen, POC: NEGATIVE
Influenza A Antigen, POC: NEGATIVE
Influenza B Antigen, POC: NEGATIVE

## 2023-12-19 MED ORDER — HYDROCODONE BIT-HOMATROP MBR 5-1.5 MG/5ML PO SOLN: 2.5000 mL | Freq: Four times a day (QID) | ORAL | 0 refills | Status: DC | PRN

## 2023-12-19 NOTE — Discharge Instructions (Signed)
Be aware, your cough medication may cause drowsiness. Please do not drive, operate heavy machinery or make important decisions while on this medication, it can cloud your judgement.  

## 2023-12-19 NOTE — ED Triage Notes (Signed)
Interpretor ID [id number]. Pt states she has had cough, headaches, body aches, and fever symptoms since yesterday.

## 2023-12-19 NOTE — ED Provider Notes (Signed)
[facility]   [id number] 12/19/23 Arrival Time: 4098  ASSESSMENT & PLAN:  1. Influenza-like illness   2. Acute cough    Discussed typical duration of likely viral illness. Results for orders placed or performed during the hospital encounter of 12/19/23  POC Covid19/Flu A&B Antigen   Collection Time: 12/19/23  9:21 AM  Result Value Ref Range   Influenza A Antigen, POC Negative    Influenza B Antigen, POC Negative    Covid Antigen, POC Negative    OTC symptom care as needed.  Discharge Medication List as of 12/19/2023  9:51 AM     START taking these medications   Details  HYDROcodone bit-homatropine (HYCODAN) 5-1.5 MG/5ML syrup Take 2.5-5 mLs by mouth every 6 (six) hours as needed for cough., Starting Thu 12/19/2023, Normal         Discharge Instructions      Be aware, your cough medication may cause drowsiness. Please do not drive, operate heavy machinery or make important decisions while on this medication, it can cloud your judgement.       Follow-up Information     Randel Pigg, Dorma Russell, MD.   Specialty: Family Medicine Why: If worsening or failing to improve as anticipated. Contact information: [address] [place] [address] [phone]                 Reviewed expectations re: course of current medical issues. Questions answered. Outlined signs and symptoms indicating need for more acute intervention. Understanding verbalized. After Visit Summary given.   SUBJECTIVE: History from: Patient. Spanish interpreter used. Casey Brewer is a 68 y.o. female. Pt states she has had cough, headaches, body aches; abrupt onset yesterday. Subj fevers. Denies: difficulty breathing. Normal PO intake without n/v/d.  OBJECTIVE:  Vitals:   12/19/23 0852 12/19/23 0856  BP:  112/74  Pulse:  96  Resp:  20  Temp:  98.3 F (36.8 C)  TempSrc:  Oral  SpO2:  96%  Weight: 70.5 kg   Height: 5\' 2"  (1.575 m)     General appearance:  alert; no distress; appears fatigued Eyes: PERRLA; EOMI; conjunctiva normal HENT: [place]; AT; with nasal congestion Neck: supple  Lungs: speaks full sentences without difficulty; unlabored; CTAB; dry cough Extremities: no edema Skin: warm and dry Neurologic: normal gait Psychological: alert and cooperative; normal mood and affect  Labs: Results for orders placed or performed during the hospital encounter of 12/19/23  POC Covid19/Flu A&B Antigen   Collection Time: 12/19/23  9:21 AM  Result Value Ref Range   Influenza A Antigen, POC Negative    Influenza B Antigen, POC Negative    Covid Antigen, POC Negative    Labs Reviewed  POC COVID19/FLU A&B COMBO - Normal  POC COVID19/FLU A&B COMBO    Imaging: No results found.  Allergies  Allergen Reactions   Dipyrone Palpitations    Past Medical History:  Diagnosis Date   Complication of anesthesia    "air escaped from hole in my nose and was in my face"   Depression    Gastritis    H. pylori infection    IBS (irritable bowel syndrome)    Social History   Socioeconomic History   Marital status: Widowed    Spouse name: Not on file   Number of children: 7   Years of education: Not on file   Highest education level: Not on file  Occupational History   Occupation: retired  Tobacco Use   Smoking status:  Never    Passive exposure: Never   Smokeless tobacco: Never  Vaping Use   Vaping status: Never Used  Substance and Sexual Activity   Alcohol use: No    Alcohol/week: 0.0 standard drinks of alcohol   Drug use: Not Currently   Sexual activity: Yes    Partners: Male    Birth control/protection: Post-menopausal, Surgical    Comment: Hysterectomy  Other Topics Concern   Not on file  Social History Narrative   ** Merged History Encounter **       Social Drivers of Corporate investment banker Strain: Not on file  Food Insecurity: Not on file  Transportation Needs: Not on file  Physical Activity: Not on file  Stress:  Not on file  Social Connections: Not on file  Intimate Partner Violence: Not on file   Family History  Problem Relation Age of Onset   Heart disease Mother    Healthy Father    Liver disease Neg Hx    Esophageal cancer Neg Hx    Colon cancer Neg Hx    Past Surgical History:  Procedure Laterality Date   ABDOMINAL HYSTERECTOMY     68yo   BLADDER SURGERY     CHOLECYSTECTOMY     ESOPHAGOGASTRODUODENOSCOPY N/A 09/01/2018   Procedure: ESOPHAGOGASTRODUODENOSCOPY (EGD);  Surgeon: Lemar Lofty., MD;  Location: [facility] ENDOSCOPY;  Service: Gastroenterology;  Laterality: N/A;   EUS N/A 09/01/2018   Procedure: UPPER ENDOSCOPIC ULTRASOUND (EUS) LINEAR;  Surgeon: Lemar Lofty., MD;  Location: [facility] ENDOSCOPY;  Service: Gastroenterology;  Laterality: N/A;   THROAT SURGERY       Mardella Layman, MD 12/19/23 1016

## 2023-12-22 ENCOUNTER — Emergency Department (HOSPITAL_COMMUNITY): Payer: Medicaid Other

## 2023-12-22 ENCOUNTER — Ambulatory Visit (INDEPENDENT_AMBULATORY_CARE_PROVIDER_SITE_OTHER): Payer: Medicaid Other

## 2023-12-22 ENCOUNTER — Encounter (HOSPITAL_COMMUNITY): Payer: Self-pay | Admitting: *Deleted

## 2023-12-22 ENCOUNTER — Ambulatory Visit
Admission: EM | Admit: 2023-12-22 | Discharge: 2023-12-22 | Disposition: A | Discharge status: Other Acute Inpatient Hospital | Payer: Medicaid Other | Attending: Physician Assistant | Admitting: Physician Assistant

## 2023-12-22 ENCOUNTER — Other Ambulatory Visit: Payer: Self-pay

## 2023-12-22 ENCOUNTER — Emergency Department (HOSPITAL_COMMUNITY)
Admission: EM | Admit: 2023-12-22 | Discharge: 2023-12-22 | Disposition: A | Discharge status: Home / Self Care | Payer: Medicaid Other | Attending: Emergency Medicine | Admitting: Emergency Medicine

## 2023-12-22 DIAGNOSIS — Z20822 Contact with and (suspected) exposure to covid-19: Secondary | ICD-10-CM | POA: Insufficient documentation

## 2023-12-22 DIAGNOSIS — R9389 Abnormal findings on diagnostic imaging of other specified body structures: Secondary | ICD-10-CM | POA: Diagnosis not present

## 2023-12-22 DIAGNOSIS — J101 Influenza due to other identified influenza virus with other respiratory manifestations: Secondary | ICD-10-CM | POA: Diagnosis not present

## 2023-12-22 DIAGNOSIS — R0602 Shortness of breath: Secondary | ICD-10-CM | POA: Diagnosis present

## 2023-12-22 LAB — BASIC METABOLIC PANEL
Anion gap: 10 (ref 5–15)
BUN: 10 mg/dL (ref 8–23)
CO2: 20 mmol/L — ABNORMAL LOW (ref 22–32)
Calcium: 8.8 mg/dL — ABNORMAL LOW (ref 8.9–10.3)
Chloride: 108 mmol/L (ref 98–111)
Creatinine, Ser: 0.79 mg/dL (ref 0.44–1.00)
GFR, Estimated: >60 mL/min (ref >60)
Glucose, Bld: 93 mg/dL (ref 70–99)
Potassium: 3.9 mmol/L (ref 3.5–5.1)
Sodium: 138 mmol/L (ref 135–145)

## 2023-12-22 LAB — CBC WITH DIFFERENTIAL/PLATELET
Abs Immature Granulocytes: 0 10*3/uL (ref 0.00–0.07)
Basophils Absolute: 0 10*3/uL (ref 0.0–0.1)
Basophils Relative: 1 %
Eosinophils Absolute: 0 10*3/uL (ref 0.0–0.5)
Eosinophils Relative: 0 %
HCT: 45.5 % (ref 36.0–46.0)
Hemoglobin: 14.9 g/dL (ref 12.0–15.0)
Immature Granulocytes: 0 %
Lymphocytes Relative: 40 %
Lymphs Abs: 1.7 10*3/uL (ref 0.7–4.0)
MCH: 29.3 pg (ref 26.0–34.0)
MCHC: 32.7 g/dL (ref 30.0–36.0)
MCV: 89.4 fL (ref 80.0–100.0)
Monocytes Absolute: 0.4 10*3/uL (ref 0.1–1.0)
Monocytes Relative: 9 %
Neutro Abs: 2.1 10*3/uL (ref 1.7–7.7)
Neutrophils Relative %: 50 %
Platelets: 295 10*3/uL (ref 150–400)
RBC: 5.09 MIL/uL (ref 3.87–5.11)
RDW: 14.4 % (ref 11.5–15.5)
WBC: 4.2 10*3/uL (ref 4.0–10.5)
nRBC: 0 % (ref 0.0–0.2)

## 2023-12-22 LAB — RESP PANEL BY RT-PCR (RSV, FLU A&B, COVID)  RVPGX2
Influenza A by PCR: POSITIVE — AB
Influenza B by PCR: NEGATIVE
Resp Syncytial Virus by PCR: NEGATIVE
SARS Coronavirus 2 by RT PCR: NEGATIVE

## 2023-12-22 LAB — POCT FASTING CBG KUC MANUAL ENTRY: POCT Glucose (KUC): 100 mg/dL — AB (ref 70–99)

## 2023-12-22 MED ORDER — IOHEXOL 350 MG/ML SOLN
75.0000 mL | Freq: Once | INTRAVENOUS | Status: AC | PRN
  Administered 2023-12-22: 75 mL via INTRAVENOUS

## 2023-12-22 MED ORDER — OSELTAMIVIR PHOSPHATE 75 MG PO CAPS: 75.0000 mg | ORAL_CAPSULE | Freq: Two times a day (BID) | ORAL | 0 refills | Status: DC

## 2023-12-22 MED ORDER — ALBUTEROL SULFATE HFA 108 (90 BASE) MCG/ACT IN AERS
1.0000 | INHALATION_SPRAY | Freq: Once | RESPIRATORY_TRACT | Status: AC
  Administered 2023-12-22: 1 via RESPIRATORY_TRACT
  Filled 2023-12-22: qty 6.7

## 2023-12-22 MED ORDER — PREDNISONE 20 MG PO TABS
60.0000 mg | ORAL_TABLET | Freq: Once | ORAL | Status: AC
  Administered 2023-12-22: 60 mg via ORAL
  Filled 2023-12-22: qty 3

## 2023-12-22 MED ORDER — IPRATROPIUM-ALBUTEROL 0.5-2.5 (3) MG/3ML IN SOLN
3.0000 mL | Freq: Once | RESPIRATORY_TRACT | Status: AC
  Administered 2023-12-22: 3 mL via RESPIRATORY_TRACT
  Filled 2023-12-22: qty 3

## 2023-12-22 NOTE — ED Triage Notes (Signed)
Due to language barrier, an interpreter was present during the history-taking and subsequent discussion (and for part of the physical exam) with this patient. Ary. Number: [id number].  Follow up to 12-19-2023 visit: Pt stated she has had cough, headaches, body aches; abrupt onset yesterday (12-18-2023). Subj fevers. Denies: difficulty breathing. Normal PO intake without n/v/d. Flu A/B & COVID19 testing Negative at that visit.  "Now I feel like I can't breath and very winded & unable to lay down flat".

## 2023-12-22 NOTE — ED Triage Notes (Signed)
"  Also I am not eating well, drinking some, last void this morning early".

## 2023-12-22 NOTE — ED Triage Notes (Addendum)
Here with family from home. Seen recently at [facility]. C/o "cold sx", sob, respiratory distress, can't lay flat, CP, cough, and abnormal chest sounds with breathing. 2V CXR done PTA viewable in chart. LS with wheezing and crackles noted. Sx onset 1/15. No relief with hycodan prescribed 1/16. Alert, NAD, calm, interactive. Pt of Dr. Edward Jolly PCP.   [employer] video interpreter [phone]

## 2023-12-22 NOTE — ED Notes (Signed)
CXR Ordered (Verbal Order): Provider unable to access a computer creating unreasonable inconvenience to the ordering provider (R. Reggie Pile) with possible delay in patient care. Acknowledged by Brian/Provider. Repeated Verbal order by Brian/Provider.

## 2023-12-22 NOTE — Discharge Instructions (Addendum)
You were seen in emergency room today for shortness of breath your CT scan is reassuring with no pneumonia or blood clot.  You are found to have influenza I have sent Tamiflu to your pharmacy.  I recommend using albuterol inhaler as needed for wheezing or shortness of breath.  Make sure staying well-hydrated you can also eat bland foods if you are to have upset stomach.  Follow-up with primary care to ensure improvement. if you develop a fever please alternate Tylenol or ibuprofen which are available over-the-counter.  Please return to emergency room with new or worsening symptoms.

## 2023-12-22 NOTE — ED Provider Notes (Signed)
[place] EMERGENCY DEPARTMENT AT [facility] Provider Note   CSN: [id number] Arrival date & time: 12/22/23  1356     History  Chief Complaint  Patient presents with   Shortness of Breath    Casey Brewer is a 68 y.o. female patient presenting to emergency room with 4 days of generalized weakness, muscle aches, cough and shortness of breath.  Patient was seen with primary care on day 1 of symptoms and tested negative for flu and COVID.  When she was seen urgent care today she had chest x-ray which showed signs of bronchitis and she was sent here for further evaluation.  Has not been given anything for her wheezing or shortness of breath.  Patient denies chest pain, abdominal pain, nausea vomiting or diarrhea.  Denies any fevers or chills.  Has past medical history of COPD or asthma.   Sent here from urgent care specifically for's further evaluation and specifically for CT scan.   Shortness of Breath      Home Medications Prior to Admission medications   Medication Sig Start Date End Date Taking? Authorizing Provider  HYDROcodone bit-homatropine (HYCODAN) 5-1.5 MG/5ML syrup Take 2.5-5 mLs by mouth every 6 (six) hours as needed for cough. 12/19/23   Mardella Layman, MD  hydrocortisone 2.5 % cream Apply 1 Application topically 2 (two) times daily. 07/30/23   [provider]  omeprazole (PRILOSEC) 40 MG capsule Take 1 capsule (40 mg total) by mouth daily. 07/24/23   Arby Barrette, MD  omeprazole (PRILOSEC) 40 MG capsule Take 40 mg by mouth daily. 08/29/23   [provider]  omeprazole (PRILOSEC) 40 MG capsule Take 40 mg by mouth daily. 07/30/23   [provider]  PROCTO-MED HC 2.5 % rectal cream Place 1 Application rectally 2 (two) times daily. 11/04/23   [provider]  propranolol (INDERAL) 10 MG tablet Take by mouth. 10/09/23   [provider]  rifabutin (MYCOBUTIN) 150 MG capsule Take 2 capsules (300 mg total) by mouth daily.  10/22/23   Comer, Belia Heman, MD  valACYclovir (VALTREX) 500 MG tablet Take 1 tablet (500 mg total) by mouth daily. 10/14/23   Rosalyn Gess, MD      Allergies    Dipyrone    Review of Systems   Review of Systems  Respiratory:  Positive for shortness of breath.     Physical Exam Updated Vital Signs BP 121/84 (BP Location: Left Arm)   Pulse 74   Temp 98.7 F (37.1 C) (Oral)   Resp 20   Wt 70.5 kg   SpO2 98%   BMI 28.43 kg/m  Physical Exam  ED Results / Procedures / Treatments   Labs (all labs ordered are listed, but only abnormal results are displayed) Labs Reviewed  RESP PANEL BY RT-PCR (RSV, FLU A&B, COVID)  RVPGX2 - Abnormal; Notable for the following components:      Result Value   Influenza A by PCR POSITIVE (*)    All other components within normal limits  BASIC METABOLIC PANEL - Abnormal; Notable for the following components:   CO2 20 (*)    Calcium 8.8 (*)    All other components within normal limits  CBC WITH DIFFERENTIAL/PLATELET    EKG None  Radiology DG Chest 2 View Result Date: 12/22/2023 CLINICAL DATA:  Shortness of breath. Wheezing. Respiratory distress. EXAM: CHEST - 2 VIEW COMPARISON:  09/23/2016 FINDINGS: Heart size is normal. Mediastinal shadows are normal. There is central bronchial thickening with mild  volume loss in the lingula. No lobar consolidation or collapse. No effusion. Bony structures unremarkable. IMPRESSION: Central bronchial thickening with mild volume loss in the lingula. No lobar consolidation or collapse. Electronically Signed   By: Paulina Fusi M.D.   On: 12/22/2023 12:59    Procedures Procedures    Medications Ordered in ED Medications  ipratropium-albuterol (DUONEB) 0.5-2.5 (3) MG/3ML nebulizer solution 3 mL (has no administration in time range)  predniSONE (DELTASONE) tablet 60 mg (has no administration in time range)    ED Course/ Medical Decision Making/ A&P                                 Medical Decision  Making Amount and/or Complexity of Data Reviewed Labs: ordered. Radiology: ordered.  Risk Prescription drug management.   Casey Brewer 68 y.o. presented today for shortness of breath.  Working DDx that I considered at this time includes, but not limited to, asthma/COPD exacerbation, URI, viral illness, anemia, ACS, PE, pneumonia, pleural effusion, lung mass.  R/o DDx: These are considered less likely due to history of present illness, physical exam, labs/imaging findings  Pmhx: Medical history of depression.  Denies past medical history of smoking, asthma or COPD.  Review of prior external notes: Reviewed chest x-ray done earlier today at urgent care which shows bronchial thickening and volume loss in lingula.  Patient was seen 12/22/2023 for same thing.  Unique Tests and My Interpretation:  CBC: No leukocytosis, no anemia BMP: No significant abnormality EKG sinus with slightly short PR  CTA Chest PE: negative   Respiratory Panel: Influenza A  Problem List / ED Course / Critical interventions / Medication management  Patient presenting to the emergency room with 4 days of flulike symptoms.  On arrival she is hemodynamically stable and in no acute distress.  She is not tachypneic and no increased work of breathing.  Does have rhonchi on exam.  And she is reporting shortness of breath.  Her labs are reassuring with no leukocytosis.  She does have influenza A.  Symptoms consistent with influenza A because she had abnormal chest x-ray done at urgent care and is endorsing shortness of breath obtained CT study to rule out pulmonary embolism or a pneumonia.  CT scan without any acute abnormality.  Patient's symptoms have significantly improved after DuoNeb and prednisone.  She is ambulating without shortness of breath.  Vitals continue to be stable throughout stay.  Patient would like to go home and is requesting discharge.  Counseled patient on how to use albuterol inhaler.  I will send  Tamiflu and she will follow-up with primary care in 2 to 3 days. I ordered medication including DuoNeb and prednisone  Reevaluation of the patient after these medicines showed that the patient improved Patients vitals assessed. Upon arrival patient is hemodynamically stable.  I have reviewed the patients home medicines and have made adjustments as needed    Plan:  F/u w/ PCP in 2-3d to ensure resolution of sx.  Patient was given return precautions. Patient stable for discharge at this time.  Patient educated on sx/dx and verbalized understanding of plan. Will return to ER for new or worsening sx.          Final Clinical Impression(s) / ED Diagnoses Final diagnoses:  Influenza A    Rx / DC Orders ED Discharge Orders     None         Seila Liston, Asher Muir  N, PA-C 12/22/23 2143    Wynetta Fines, MD 12/22/23 [phone]

## 2023-12-22 NOTE — ED Provider Notes (Signed)
[facility]    CSN: [id number] Arrival date & time: 12/22/23  1149      History   Chief Complaint Chief Complaint  Patient presents with   Cough   Respiratory Distress    HPI Casey Brewer is a 68 y.o. female.   Patient Casey Brewer today for evaluation of cough, headaches, body aches that started a few days ago.  She reports she has had subjective fevers.  She states she has had normal p.o. intake without nausea, vomiting, diarrhea.  She has had flu and COVID testing that was negative.  She states that now she is feeling as if she cannot breathe and feels very winded.  She notes she is unable to lay down flat due to same.  The history is provided by the patient and a relative. The history is limited by a language barrier. A language interpreter was used Casey Brewer).  Cough Associated symptoms: shortness of breath   Associated symptoms: no chills, no ear pain, no eye discharge, no fever and no sore throat     Past Medical History:  Diagnosis Date   Complication of anesthesia    "air escaped from hole in my nose and was in my face"   Depression    Gastritis    H. pylori infection    IBS (irritable bowel syndrome)     Patient Active Problem List   Diagnosis Date Noted   Tremor 10/28/2023   H. pylori infection 10/15/2023   Localized osteoporosis without current pathological fracture 09/26/2023   Bile duct stone     Past Surgical History:  Procedure Laterality Date   ABDOMINAL HYSTERECTOMY     68yo   BLADDER SURGERY     CHOLECYSTECTOMY     ESOPHAGOGASTRODUODENOSCOPY N/A 09/01/2018   Procedure: ESOPHAGOGASTRODUODENOSCOPY (EGD);  Surgeon: Lemar Lofty., MD;  Location: [facility] ENDOSCOPY;  Service: Gastroenterology;  Laterality: N/A;   EUS N/A 09/01/2018   Procedure: UPPER ENDOSCOPIC ULTRASOUND (EUS) LINEAR;  Surgeon: Lemar Lofty., MD;  Location: [facility] ENDOSCOPY;  Service: Gastroenterology;  Laterality: N/A;   THROAT SURGERY      OB History      Gravida  7   Para  7   Term      Preterm      AB      Living  7      SAB      IAB      Ectopic      Multiple      Live Births               Home Medications    Prior to Admission medications   Medication Sig Start Date End Date Taking? Authorizing Provider  HYDROcodone bit-homatropine (HYCODAN) 5-1.5 MG/5ML syrup Take 2.5-5 mLs by mouth every 6 (six) hours as needed for cough. 12/19/23  Yes Hagler, Arlys John, MD  hydrocortisone 2.5 % cream Apply 1 Application topically 2 (two) times daily. 07/30/23   [provider]  omeprazole (PRILOSEC) 40 MG capsule Take 1 capsule (40 mg total) by mouth daily. 07/24/23   Arby Barrette, MD  omeprazole (PRILOSEC) 40 MG capsule Take 40 mg by mouth daily. 08/29/23   [provider]  omeprazole (PRILOSEC) 40 MG capsule Take 40 mg by mouth daily. 07/30/23   [provider]  PROCTO-MED HC 2.5 % rectal cream Place 1 Application rectally 2 (two) times daily. 11/04/23   [provider]  propranolol (INDERAL) 10 MG tablet Take by mouth. 10/09/23  [provider]  rifabutin (MYCOBUTIN) 150 MG capsule Take 2 capsules (300 mg total) by mouth daily. 10/22/23   Comer, Belia Heman, MD  valACYclovir (VALTREX) 500 MG tablet Take 1 tablet (500 mg total) by mouth daily. 10/14/23   Rosalyn Gess, MD    Family History Family History  Problem Relation Age of Onset   Heart disease Mother    Healthy Father    Liver disease Neg Hx    Esophageal cancer Neg Hx    Colon cancer Neg Hx     Social History Social History   Tobacco Use   Smoking status: Never    Passive exposure: Never   Smokeless tobacco: Never  Vaping Use   Vaping status: Never Used  Substance Use Topics   Alcohol use: No    Alcohol/week: 0.0 standard drinks of alcohol   Drug use: Not Currently     Allergies   Dipyrone   Review of Systems Review of Systems  Constitutional:  Negative for chills and fever.  HENT:  Negative for  congestion, ear pain and sore throat.   Eyes:  Negative for discharge and redness.  Respiratory:  Positive for cough and shortness of breath.   Gastrointestinal:  Negative for abdominal pain, diarrhea, nausea and vomiting.     Physical Exam Triage Vital Signs ED Triage Vitals  Encounter Vitals Group     BP 12/22/23 1220 112/78     Systolic BP Percentile --      Diastolic BP Percentile --      Pulse Rate 12/22/23 1220 73     Resp 12/22/23 1220 20     Temp 12/22/23 1220 98.1 F (36.7 C)     Temp Source 12/22/23 1220 Oral     SpO2 12/22/23 1220 96 %     Weight 12/22/23 1216 155 lb 6.8 oz (70.5 kg)     Height 12/22/23 1216 5\' 2"  (1.575 m)     Head Circumference --      Peak Flow --      Pain Score 12/22/23 1211 0     Pain Loc --      Pain Education --      Exclude from Growth Chart --    No data found.  Updated Vital Signs BP 112/78 (BP Location: Left Arm)   Pulse 73   Temp 98.1 F (36.7 C) (Oral)   Resp 20   Ht 5\' 2"  (1.575 m)   Wt 155 lb 6.8 oz (70.5 kg)   SpO2 96%   BMI 28.43 kg/m   Visual Acuity Right Eye Distance:   Left Eye Distance:   Bilateral Distance:    Right Eye Near:   Left Eye Near:    Bilateral Near:     Physical Exam Vitals and nursing note reviewed.  Constitutional:      General: She is not in acute distress.    Appearance: Normal appearance. She is not ill-appearing.  HENT:     Head: Normocephalic and atraumatic.  Eyes:     Conjunctiva/sclera: Conjunctivae normal.  Cardiovascular:     Rate and Rhythm: Normal rate.  Pulmonary:     Effort: Pulmonary effort is normal. No respiratory distress.  Neurological:     Mental Status: She is alert.  Psychiatric:        Mood and Affect: Mood normal.        Behavior: Behavior normal.        Thought Content: Thought content normal.  UC Treatments / Results  Labs (all labs ordered are listed, but only abnormal results are displayed) Labs Reviewed  POCT FASTING CBG [facility] MANUAL ENTRY -  Abnormal; Notable for the following components:      Result Value   POCT Glucose ([facility]) 100 (*)    All other components within normal limits    EKG   Radiology DG Chest 2 View Result Date: 12/22/2023 CLINICAL DATA:  Shortness of breath. Wheezing. Respiratory distress. EXAM: CHEST - 2 VIEW COMPARISON:  09/23/2016 FINDINGS: Heart size is normal. Mediastinal shadows are normal. There is central bronchial thickening with mild volume loss in the lingula. No lobar consolidation or collapse. No effusion. Bony structures unremarkable. IMPRESSION: Central bronchial thickening with mild volume loss in the lingula. No lobar consolidation or collapse. Electronically Signed   By: Paulina Fusi M.D.   On: 12/22/2023 12:59    Procedures Procedures (including critical care time)  Medications Ordered in UC Medications - No data to display  Initial Impression / Assessment and Plan / UC Course  I have reviewed the triage vital signs and the nursing notes.  Pertinent labs & imaging results that were available during my care of the patient were reviewed by me and considered in my medical decision making (see chart for details).    X-ray with reported volume loss in the lingula.  While this might be atelectasis concerns for possible other etiologies given inability to lie flat and worsening shortness of breath.  Recommended further evaluation in the emergency department as I suspect a CT scan might be helpful.  Patient is agreeable with same.  Final Clinical Impressions(s) / UC Diagnoses   Final diagnoses:  Abnormal chest x-ray     Discharge Instructions       Please report to emergency department after leaving urgent care.     ED Prescriptions   None    PDMP not reviewed this encounter.   Tomi Bamberger, PA-C 12/22/23 1324

## 2023-12-22 NOTE — ED Notes (Signed)
Patient is being discharged from the Urgent Care and sent to the Emergency Department via POV . Per RM, patient is in need of higher level of care due to SOB. Patient is aware and verbalizes understanding of plan of care.  Vitals:   12/22/23 1220  BP: 112/78  Pulse: 73  Resp: 20  Temp: 98.1 F (36.7 C)  SpO2: 96%

## 2023-12-22 NOTE — Discharge Instructions (Signed)
  Please report to emergency department after leaving urgent care.

## 2023-12-25 ENCOUNTER — Emergency Department (HOSPITAL_COMMUNITY): Payer: Medicaid Other

## 2023-12-25 ENCOUNTER — Emergency Department (HOSPITAL_COMMUNITY)
Admission: EM | Admit: 2023-12-25 | Discharge: 2023-12-25 | Disposition: A | Discharge status: Home / Self Care | Payer: Medicaid Other | Attending: Student | Admitting: Student

## 2023-12-25 ENCOUNTER — Other Ambulatory Visit: Payer: Self-pay

## 2023-12-25 DIAGNOSIS — R0602 Shortness of breath: Secondary | ICD-10-CM | POA: Insufficient documentation

## 2023-12-25 DIAGNOSIS — R0789 Other chest pain: Secondary | ICD-10-CM | POA: Diagnosis present

## 2023-12-25 DIAGNOSIS — R062 Wheezing: Secondary | ICD-10-CM

## 2023-12-25 DIAGNOSIS — R059 Cough, unspecified: Secondary | ICD-10-CM | POA: Insufficient documentation

## 2023-12-25 DIAGNOSIS — J101 Influenza due to other identified influenza virus with other respiratory manifestations: Secondary | ICD-10-CM

## 2023-12-25 LAB — TROPONIN I (HIGH SENSITIVITY): Troponin I (High Sensitivity): <2 ng/L (ref <18)

## 2023-12-25 LAB — COMPREHENSIVE METABOLIC PANEL
ALT: 33 U/L (ref 0–44)
AST: 25 U/L (ref 15–41)
Albumin: 3.8 g/dL (ref 3.5–5.0)
Alkaline Phosphatase: 58 U/L (ref 38–126)
Anion gap: 8 (ref 5–15)
BUN: 8 mg/dL (ref 8–23)
CO2: 20 mmol/L — ABNORMAL LOW (ref 22–32)
Calcium: 9 mg/dL (ref 8.9–10.3)
Chloride: 108 mmol/L (ref 98–111)
Creatinine, Ser: 0.51 mg/dL (ref 0.44–1.00)
GFR, Estimated: >60 mL/min (ref >60)
Glucose, Bld: 108 mg/dL — ABNORMAL HIGH (ref 70–99)
Potassium: 3.2 mmol/L — ABNORMAL LOW (ref 3.5–5.1)
Sodium: 136 mmol/L (ref 135–145)
Total Bilirubin: 1 mg/dL (ref 0.0–1.2)
Total Protein: 7.6 g/dL (ref 6.5–8.1)

## 2023-12-25 LAB — CBC
HCT: 43.7 % (ref 36.0–46.0)
Hemoglobin: 14.1 g/dL (ref 12.0–15.0)
MCH: 29 pg (ref 26.0–34.0)
MCHC: 32.3 g/dL (ref 30.0–36.0)
MCV: 89.7 fL (ref 80.0–100.0)
Platelets: 344 10*3/uL (ref 150–400)
RBC: 4.87 MIL/uL (ref 3.87–5.11)
RDW: 14.3 % (ref 11.5–15.5)
WBC: 9.7 10*3/uL (ref 4.0–10.5)
nRBC: 0 % (ref 0.0–0.2)

## 2023-12-25 MED ORDER — ALBUTEROL SULFATE HFA 108 (90 BASE) MCG/ACT IN AERS: 2.0000 | INHALATION_SPRAY | RESPIRATORY_TRACT | Status: DC | PRN

## 2023-12-25 MED ORDER — METHYLPREDNISOLONE SODIUM SUCC 125 MG IJ SOLR
125.0000 mg | Freq: Once | INTRAMUSCULAR | Status: AC
  Administered 2023-12-25: 125 mg via INTRAVENOUS
  Filled 2023-12-25: qty 2

## 2023-12-25 MED ORDER — METHYLPREDNISOLONE 4 MG PO TBPK: ORAL_TABLET | ORAL | 0 refills | Status: DC

## 2023-12-25 MED ORDER — ALBUTEROL SULFATE (2.5 MG/3ML) 0.083% IN NEBU: 2.5000 mg | INHALATION_SOLUTION | Freq: Four times a day (QID) | RESPIRATORY_TRACT | 12 refills | Status: DC | PRN

## 2023-12-25 MED ORDER — IPRATROPIUM-ALBUTEROL 0.5-2.5 (3) MG/3ML IN SOLN
9.0000 mL | Freq: Once | RESPIRATORY_TRACT | Status: AC
  Administered 2023-12-25: 9 mL via RESPIRATORY_TRACT
  Filled 2023-12-25: qty 9

## 2023-12-25 NOTE — ED Provider Notes (Signed)
EMERGENCY DEPARTMENT AT [facility] Provider Note  CSN: [id number] Arrival date & time: 12/25/23 1311  Chief Complaint(s) Chest Pain and Shortness of Breath  HPI Casey Brewer is a 68 y.o. female who presents emergency room for evaluation of chest tightness, cough and shortness of breath.  Patient recently seen on 12/22/2023 and diagnosed with influenza A.  She had a PE study done at that time that was reassuringly negative.  Was discharged with Tamiflu, steroids and albuterol inhaler and symptoms not improving at home.  Patient arrives with expiratory wheezing and complaints of persistent chest tightness.  Denies abdominal pain, nausea, vomiting, fever or other systemic symptoms.   Past Medical History Past Medical History:  Diagnosis Date   Complication of anesthesia    "air escaped from hole in my nose and was in my face"   Depression    Gastritis    H. pylori infection    IBS (irritable bowel syndrome)    Patient Active Problem List   Diagnosis Date Noted   Tremor 10/28/2023   H. pylori infection 10/15/2023   Localized osteoporosis without current pathological fracture 09/26/2023   Bile duct stone    Home Medication(s) Prior to Admission medications   Medication Sig Start Date End Date Taking? Authorizing Provider  HYDROcodone bit-homatropine (HYCODAN) 5-1.5 MG/5ML syrup Take 2.5-5 mLs by mouth every 6 (six) hours as needed for cough. 12/19/23   Mardella Layman, MD  hydrocortisone 2.5 % cream Apply 1 Application topically 2 (two) times daily. 07/30/23   [provider]  omeprazole (PRILOSEC) 40 MG capsule Take 1 capsule (40 mg total) by mouth daily. 07/24/23   Arby Barrette, MD  omeprazole (PRILOSEC) 40 MG capsule Take 40 mg by mouth daily. 08/29/23   [provider]  omeprazole (PRILOSEC) 40 MG capsule Take 40 mg by mouth daily. 07/30/23   [provider]  oseltamivir (TAMIFLU) 75 MG capsule Take 1 capsule (75 mg total) by  mouth every 12 (twelve) hours. 12/22/23   Barrett, Jamie N, PA-C  PROCTO-MED HC 2.5 % rectal cream Place 1 Application rectally 2 (two) times daily. 11/04/23   [provider]  propranolol (INDERAL) 10 MG tablet Take by mouth. 10/09/23   [provider]  rifabutin (MYCOBUTIN) 150 MG capsule Take 2 capsules (300 mg total) by mouth daily. 10/22/23   Comer, Belia Heman, MD  valACYclovir (VALTREX) 500 MG tablet Take 1 tablet (500 mg total) by mouth daily. 10/14/23   Rosalyn Gess, MD                                                                                                                                    Past Surgical History Past Surgical History:  Procedure Laterality Date   ABDOMINAL HYSTERECTOMY     68yo   BLADDER SURGERY     CHOLECYSTECTOMY     ESOPHAGOGASTRODUODENOSCOPY N/A 09/01/2018   Procedure: ESOPHAGOGASTRODUODENOSCOPY (  EGD);  Surgeon: Mansouraty, Netty Starring., MD;  Location: [facility] ENDOSCOPY;  Service: Gastroenterology;  Laterality: N/A;   EUS N/A 09/01/2018   Procedure: UPPER ENDOSCOPIC ULTRASOUND (EUS) LINEAR;  Surgeon: Lemar Lofty., MD;  Location: [facility] ENDOSCOPY;  Service: Gastroenterology;  Laterality: N/A;   THROAT SURGERY     Family History Family History  Problem Relation Age of Onset   Heart disease Mother    Healthy Father    Liver disease Neg Hx    Esophageal cancer Neg Hx    Colon cancer Neg Hx     Social History Social History   Tobacco Use   Smoking status: Never    Passive exposure: Never   Smokeless tobacco: Never  Vaping Use   Vaping status: Never Used  Substance Use Topics   Alcohol use: No    Alcohol/week: 0.0 standard drinks of alcohol   Drug use: Not Currently   Allergies Dipyrone  Review of Systems Review of Systems  Respiratory:  Positive for cough, chest tightness and shortness of breath.   Cardiovascular:  Positive for chest pain.    Physical Exam Vital Signs  I have reviewed the triage vital signs BP  120/69 (BP Location: Left Arm)   Pulse 65   Temp 98.2 F (36.8 C) (Oral)   Resp 14   SpO2 100%   Physical Exam Vitals and nursing note reviewed.  Constitutional:      General: She is not in acute distress.    Appearance: She is well-developed.  HENT:     Head: Normocephalic and atraumatic.  Eyes:     Conjunctiva/sclera: Conjunctivae normal.  Cardiovascular:     Rate and Rhythm: Normal rate and regular rhythm.     Heart sounds: No murmur heard. Pulmonary:     Effort: Pulmonary effort is normal. No respiratory distress.     Breath sounds: Wheezing present.  Abdominal:     Palpations: Abdomen is soft.     Tenderness: There is no abdominal tenderness.  Musculoskeletal:        General: No swelling.     Cervical back: Neck supple.  Skin:    General: Skin is warm and dry.     Capillary Refill: Capillary refill takes less than 2 seconds.  Neurological:     Mental Status: She is alert.  Psychiatric:        Mood and Affect: Mood normal.     ED Results and Treatments Labs (all labs ordered are listed, but only abnormal results are displayed) Labs Reviewed  COMPREHENSIVE METABOLIC PANEL - Abnormal; Notable for the following components:      Result Value   Potassium 3.2 (*)    CO2 20 (*)    Glucose, Bld 108 (*)    All other components within normal limits  CBC  TROPONIN I (HIGH SENSITIVITY)  Radiology DG Chest 2 View Result Date: 12/25/2023 CLINICAL DATA:  Shortness of breath. EXAM: CHEST - 2 VIEW COMPARISON:  Chest radiograph dated 12/22/2023. FINDINGS: Low lung volumes. The cardiomediastinal silhouette is within normal limits. No focal consolidation, pleural effusion, or pneumothorax. No acute osseous abnormality. IMPRESSION: Low lung volumes.  No acute cardiopulmonary findings. Electronically Signed   By: Hart Robinsons M.D.   On: 12/25/2023 14:28     Pertinent labs & imaging results that were available during my care of the patient were reviewed by me and considered in my medical decision making (see MDM for details).  Medications Ordered in ED Medications  albuterol (VENTOLIN HFA) 108 (90 Base) MCG/ACT inhaler 2 puff (has no administration in time range)  ipratropium-albuterol (DUONEB) 0.5-2.5 (3) MG/3ML nebulizer solution 9 mL (9 mLs Nebulization Given 12/25/23 1756)  methylPREDNISolone sodium succinate (SOLU-MEDROL) 125 mg/2 mL injection 125 mg (125 mg Intravenous Given 12/25/23 1756)                                                                                                                                     Procedures Procedures  (including critical care time)  Medical Decision Making / ED Course   This patient presents to the ED for concern of ***, this involves an extensive number of treatment options, and is a complaint that carries with it a high risk of complications and morbidity.  The differential diagnosis includes ***  MDM: ***   Additional history obtained: -Additional history obtained from *** -External records from outside source obtained and reviewed including: Chart review including previous notes, labs, imaging, consultation notes   Lab Tests: -I ordered, reviewed, and interpreted labs.   The pertinent results include:   Labs Reviewed  COMPREHENSIVE METABOLIC PANEL - Abnormal; Notable for the following components:      Result Value   Potassium 3.2 (*)    CO2 20 (*)    Glucose, Bld 108 (*)    All other components within normal limits  CBC  TROPONIN I (HIGH SENSITIVITY)      EKG ***  EKG Interpretation Date/Time:    Ventricular Rate:    PR Interval:    QRS Duration:    QT Interval:    QTC Calculation:   R Axis:      Text Interpretation:           Imaging Studies ordered: I ordered imaging studies including *** I independently visualized and interpreted imaging. I agree with  the radiologist interpretation   Medicines ordered and prescription drug management: Meds ordered this encounter  Medications   albuterol (VENTOLIN HFA) 108 (90 Base) MCG/ACT inhaler 2 puff   ipratropium-albuterol (DUONEB) 0.5-2.5 (3) MG/3ML nebulizer solution 9 mL   methylPREDNISolone sodium succinate (SOLU-MEDROL) 125 mg/2 mL injection 125 mg    -I have reviewed the patients home medicines and have made adjustments as needed  Critical interventions ***  Consultations Obtained:  I requested consultation with the ***,  and discussed lab and imaging findings as well as pertinent plan - they recommend: ***   Cardiac Monitoring: The patient was maintained on a cardiac monitor.  I personally viewed and interpreted the cardiac monitored which showed an underlying rhythm of: ***  Social Determinants of Health:  Factors impacting patients care include: ***   Reevaluation: After the interventions noted above, I reevaluated the patient and found that they have :{resolved/improved/worsened:23923::"improved"}  Co morbidities that complicate the patient evaluation  Past Medical History:  Diagnosis Date   Complication of anesthesia    "air escaped from hole in my nose and was in my face"   Depression    Gastritis    H. pylori infection    IBS (irritable bowel syndrome)       Dispostion: I considered admission for this patient, ***     Final Clinical Impression(s) / ED Diagnoses Final diagnoses:  None     @PCDICTATION @

## 2023-12-25 NOTE — ED Triage Notes (Signed)
Pt arrived via POV. C/o chest pain and SOB. Pt dx with Flu A the other day. Pt states they do not feel better.  AOx4

## 2023-12-31 NOTE — Telephone Encounter (Signed)
 Rx needed prior auth. Submitted pa waiting on determination

## 2023-12-31 NOTE — Progress Notes (Signed)
 [address] - AMBULATORY [facility]  - [facility]IC[facility] [phone] [place] [id number]     December 31, 2023  Patient ID: Casey Brewer is a 68 y.o. female   Chief Complaint  Patient presents with  . Flu Symptoms     HPI: Casey Brewer presents to the clinic to follow-up after being diagnosed with flu on 1/16.  She was treated with Tamiflu , had some bronchitis for which she was given steroid taper and albuterol .  With albuterol  she felt palpitations, tremulous and anxious for which she has stopped using the albuterol  as of yesterday.  Today she is feeling much better, her respiratory symptoms has significantly improved however she still feels weak and fatigued.  She has not been drinking much water and has not eating much at all.  She reports that her appetite is poor and does not have any desire of taking anything by mouth.  She is urinating but dark.  She reports that when she is stands up she gets dizzy.  She has not passed out.  She denies any chest pain, palpitations or shortness of breath.  On her previous appointment she was prescribed Prolia for her osteoporosis, however insurance denies this.  She has not started any medication for osteoporosis.  ROS   A complete ROS was performed with pertinent positives/negatives noted in the HPI. The remainder of the ROS are negative.  Assessment/Plan:    Casey Brewer was seen today for flu symptoms.  Diagnoses and all orders for this visit:  Hypotension due to hypovolemia Due to poor p.o. intake.  Patient and daughter assured me that she is going to increase her water intake, she drank 2 ounces glasses of water here and her blood pressure improved some.  Will check blood work.  Advised to monitor blood pressure at home.  Advised that she needs to increase her p.o. intake.  Orders: -     Basic Metabolic Panel; Future -     Magnesium; Future  Age-related osteoporosis  without current pathological fracture Will start Boniva.  If GERD becomes an issue we will need to stop medication and push for Prolia.  Orders: -     ibandronate (BONIVA) 150 mg tablet; Take 1 tablet (150 mg total) by mouth every 30 (thirty) days.   Patient verbalizes understanding and in agreement with the above plan. All questions answered.  Medication side effects discussed with patient. Advised patient to call clinic or return for visit if symptoms occur. Goals of care discussed with patient including med compliance and adequate follow up.  Return in about 6 months (around 06/29/2024) for Annual physical.   Physical Exam:   Vital Signs  Vitals:   12/31/23 1002 12/31/23 1112  BP: (!) 84/50 92/60  BP Location: Left arm   Patient Position: Sitting   Pulse: 76   Resp: 18   SpO2: 98%   Weight: 66.1 kg (145 lb 12.8 oz)   Height: 1.575 m (5' 2)      Constitutional: Well-developed and well-nourished. Sitting comfortably conversing normally. Pleasant.  Neck: Supple, full range of motion. No JVD. Cardiovascular: +S1S2, regular rate and rhythm, no murmurs, gallops or rubs appreciated.  Respiratory: Normal effort, clear to auscultation bilaterally. No wheezes, rales or rhonchi noted.  Gastrointestinal: Abdomen soft, nontender nondistended, positive bowel sounds, no organomegaly noted. Extremities: No cyanosis, clubbing or edema. Pulses 2+ all 4 extremities. Neuro: Alert and oriented  x 3. Normal gait.   Skin: Skin is warm and dry. No rash noted. Psychiatric: Behavior is cooperative and polite. Mood and affect is appropriate.    Medical History     Allergies Allergies  Allergen Reactions  . Dipyrone Palpitations    Medications Current Outpatient Medications  Medication Sig Dispense Refill  . albuterol  2.5 mg /3 mL (0.083 %) nebulizer solution Inhale 2.5 mg every 6 (six) hours as needed for wheezing or shortness of breath.    . albuterol  HFA (PROVENTIL  HFA;VENTOLIN   HFA;PROAIR  HFA) 90 mcg/actuation inhaler Inhale 2 puffs every 6 (six) hours as needed for wheezing.    . amoxicillin  (AMOXIL ) 500 mg tablet Take 1,000 mg by mouth. (Patient not taking: Reported on 12/31/2023)    . hydrocortisone  (Procto-Med HC ) 2.5 % rectal cream Insert 1 Application into the rectum 2 (two) times a day. (Patient not taking: Reported on 12/31/2023) 30 g 1  . ibandronate (BONIVA) 150 mg tablet Take 1 tablet (150 mg total) by mouth every 30 (thirty) days. 3 tablet 3  . omeprazole  (PriLOSEC) 40 mg DR capsule Take 1 capsule (40 mg total) by mouth in the morning. (Patient not taking: Reported on 12/31/2023) 90 capsule 1  . propranoloL (INDERAL) 10 mg tablet Take 2 tablets (20 mg total) by mouth 2 (two) times a day. (Patient not taking: Reported on 12/31/2023) 180 tablet 1  . rifabutin  (MYCOBUTIN ) 150 mg capsule Take 300 mg by mouth. (Patient not taking: Reported on 12/31/2023)    . sucralfate  (CARAFATE ) 100 mg/mL oral suspension Take 1 g by mouth. (Patient not taking: Reported on 12/31/2023)    . valACYclovir  (VALTREX ) 500 mg tablet Take 500 mg by mouth daily. (Patient not taking: Reported on 12/31/2023)     No current facility-administered medications for this visit.    Medical history Past Medical History:  Diagnosis Date  . Herpesvirus 2     Surgical History Past Surgical History:  Procedure Laterality Date  . BLADDER REPAIR    . CHOLECYSTECTOMY    . TONSILLECTOMY AND ADENOIDECTOMY    . TOTAL ABDOMINAL HYSTERECTOMY      Social History Social History   Tobacco Use  Smoking Status Never  Smokeless Tobacco Never    Family History Family History  Problem Relation Name Age of Onset  . Hypertension Mother    . Pacemaker Mother    . No Known Problems Father    . Colon cancer Neg Hx    . Breast cancer Neg Hx      I have reviewed and (if needed) updated the patient's problem list, medications, allergies, past medical and surgical history, social and family  history.    This document serves as a record of services personally performed by Dr. Nikki.  It was created on their behalf by Delon Macario Fairly, LPN, a trained medical scribe, and Licensed Practical Nurse (LPN). During the course of documenting the history, physical exam and medical decision making, I was functioning as a Stage manager. The creation of this record is the provider's dictation and/or activities during the visit.  Electronically signed by Delon Macario Fairly, LPN 8/71/7974 2:94 AM  I agree the documentation is accurate and complete.  Aliene Nikki, MD  Note - This document was created using aid of voice recognition Dragon dictation software

## 2024-01-01 NOTE — Telephone Encounter (Signed)
 PA denied. She needs to try and fail 2 medications. I'll look in the medicaid preferred lists

## 2024-01-01 NOTE — Telephone Encounter (Signed)
 Pt informed and advised that rx was sent

## 2024-01-14 ENCOUNTER — Ambulatory Visit: Payer: Medicaid Other | Admitting: Physician Assistant

## 2024-01-28 ENCOUNTER — Ambulatory Visit (INDEPENDENT_AMBULATORY_CARE_PROVIDER_SITE_OTHER): Payer: Medicaid Other | Admitting: Gastroenterology

## 2024-01-28 ENCOUNTER — Other Ambulatory Visit (INDEPENDENT_AMBULATORY_CARE_PROVIDER_SITE_OTHER): Payer: Medicaid Other

## 2024-01-28 ENCOUNTER — Encounter: Payer: Self-pay | Admitting: Gastroenterology

## 2024-01-28 VITALS — BP 90/60 | HR 84 | Ht 60.0 in | Wt 148.5 lb

## 2024-01-28 DIAGNOSIS — R195 Other fecal abnormalities: Secondary | ICD-10-CM | POA: Insufficient documentation

## 2024-01-28 DIAGNOSIS — R194 Change in bowel habit: Secondary | ICD-10-CM | POA: Insufficient documentation

## 2024-01-28 DIAGNOSIS — I880 Nonspecific mesenteric lymphadenitis: Secondary | ICD-10-CM

## 2024-01-28 DIAGNOSIS — K649 Unspecified hemorrhoids: Secondary | ICD-10-CM

## 2024-01-28 DIAGNOSIS — R599 Enlarged lymph nodes, unspecified: Secondary | ICD-10-CM | POA: Insufficient documentation

## 2024-01-28 DIAGNOSIS — Z8619 Personal history of other infectious and parasitic diseases: Secondary | ICD-10-CM

## 2024-01-28 LAB — TSH: TSH: 2.05 u[IU]/mL (ref 0.35–5.50)

## 2024-01-28 NOTE — Progress Notes (Addendum)
 GASTROENTEROLOGY OUTPATIENT CLINIC VISIT   Primary Care Provider Randel Pigg, Dorma Russell, MD [address] [place] [address] [place] [address] [phone]  Patient Profile: Casey Brewer is a 68 y.o. female with a pmh significant for osteopenia, H. pylori (status post multiple treatments with final eradication) gastritis, status postcholecystectomy, hemorrhoids, mesenteric lymph nodes NOS.  The patient presents to the [facility] for an evaluation and management of problem(s) noted below:  Problem List 1. History of Helicobacter infection   2. Lymph nodes enlarged   3. Change in bowel habits   4. Loose stools   5. Hemorrhoids, unspecified hemorrhoid type    Discussed the use of AI scribe software for clinical note transcription with the patient, who gave verbal consent to proceed.  History of Present Illness Please see prior GI notes for full details of HPI.  Interval History The patient presents for follow-up.  She has a history of H. pylori infection, which was finally treated successfully after multiple antibiotic courses with a rifabutin regimen by our ID colleagues after no success with other medications.  Since completion of her antibiotics, she does feel better in regard to her abdominal pain/discomfort that she had been experiencing.  However, she had the Flu earlier this year.  This caused her significant issues in regard to weakness and fatigue for weeks.  In addition, she also began to experience significant changes in her bowel habits with loose and watery diarrheal movements after eating meals, which have continued (though a bit better than at onset).  She has not had any incontinence.  She has not noted any blood in her stools.  However, her "hemorrhoids" have been exacerbated by the diarrheal movements.   Prior to this, she was normally constipated and would not use the restroom except ever 2-3 days per report.   GI Review of Systems Positive as  above Negative for dysphagia, odynophagia, nausea, vomiting  Review of Systems General: Denies fevers/chills/weight loss unintentionally Cardiovascular: Denies chest pain Pulmonary: Denies shortness of breath Gastroenterological: See HPI Genitourinary: Denies darkened urine  Hematological: Denies easy bruising/bleeding Endocrine: Denies temperature intolerance Dermatological: Denies jaundice Psychological: Mood is stable   Medications Current Outpatient Medications  Medication Sig Dispense Refill   alendronate (FOSAMAX) 70 MG tablet Take 70 mg by mouth once a week.     Calcium Carbonate-Vitamin D (CALCIUM-VITAMIN D PO) Take 1 capsule by mouth daily.     Cyanocobalamin (VITAMIN B12 PO) Take 1 tablet by mouth daily.     No current facility-administered medications for this visit.    Allergies Allergies  Allergen Reactions   Dipyrone Palpitations    Histories Past Medical History:  Diagnosis Date   Complication of anesthesia    "air escaped from hole in my nose and was in my face"   Depression    Gastritis    H. pylori infection    IBS (irritable bowel syndrome)    Past Surgical History:  Procedure Laterality Date   ABDOMINAL HYSTERECTOMY     68yo   BLADDER SURGERY     CHOLECYSTECTOMY     ESOPHAGOGASTRODUODENOSCOPY N/A 09/01/2018   Procedure: ESOPHAGOGASTRODUODENOSCOPY (EGD);  Surgeon: Lemar Lofty., MD;  Location: [facility] ENDOSCOPY;  Service: Gastroenterology;  Laterality: N/A;   EUS N/A 09/01/2018   Procedure: UPPER ENDOSCOPIC ULTRASOUND (EUS) LINEAR;  Surgeon: Lemar Lofty., MD;  Location: [facility] ENDOSCOPY;  Service: Gastroenterology;  Laterality: N/A;   THROAT SURGERY     Social History   Socioeconomic History  Marital status: Widowed    Spouse name: Not on file   Number of children: 7   Years of education: Not on file   Highest education level: Not on file  Occupational History   Occupation: retired  Tobacco Use   Smoking status: Never     Passive exposure: Never   Smokeless tobacco: Never  Vaping Use   Vaping status: Never Used  Substance and Sexual Activity   Alcohol use: No    Alcohol/week: 0.0 standard drinks of alcohol   Drug use: Not Currently   Sexual activity: Not Currently    Partners: Male    Birth control/protection: Post-menopausal, Surgical    Comment: Hysterectomy  Other Topics Concern   Not on file  Social History Narrative   ** Merged History Encounter **       Social Drivers of Corporate investment banker Strain: Not on file  Food Insecurity: Not on file  Transportation Needs: Not on file  Physical Activity: Not on file  Stress: Not on file  Social Connections: Not on file  Intimate Partner Violence: Not on file   Family History  Problem Relation Age of Onset   Heart disease Mother    Healthy Father    Liver disease Neg Hx    Esophageal cancer Neg Hx    Colon cancer Neg Hx    Inflammatory bowel disease Neg Hx    Pancreatic cancer Neg Hx    Rectal cancer Neg Hx    Stomach cancer Neg Hx    I have reviewed her medical, social, and family history in detail and updated the electronic medical record as necessary.    PHYSICAL EXAMINATION  BP 90/60 (BP Location: Left Arm, Patient Position: Sitting, Cuff Size: Normal)   Pulse 84   Ht 5' (1.524 m)   Wt 148 lb 8 oz (67.4 kg)   BMI 29.00 kg/m  Wt Readings from Last 3 Encounters:  01/28/24 148 lb 8 oz (67.4 kg)  12/22/23 155 lb 6.8 oz (70.5 kg)  12/22/23 155 lb 6.8 oz (70.5 kg)  GEN: NAD, appears stated age, doesn't appear chronically ill, accompanied by daughter PSYCH: Cooperative, without pressured speech EYE: Conjunctivae pink, sclerae anicteric ENT: MMM CV: Nontachycardic RESP: No audible wheezing GI: NABS, soft, NT/ND, without rebound or guarding GU: DRE deferred by patient but would consider at future visit if needed MSK/EXT: No significant lower extremity edema SKIN: No jaundice NEURO:  Alert & Oriented x 3, no focal  deficits   REVIEW OF DATA  I reviewed the following data at the time of this encounter:  GI Procedures and Studies  No new studies to review  Laboratory Studies  Reviewed those in epic  Imaging Studies  No new studies to review   ASSESSMENT  Ms. Casey Brewer is a 68 y.o. female with a pmh significant for osteopenia, H. pylori (status post multiple treatments with final eradication) gastritis, status postcholecystectomy, hemorrhoids, mesenteric lymph nodes NOS.  The patient is seen today for evaluation and management of:  1. History of Helicobacter infection   2. Lymph nodes enlarged   3. Change in bowel habits   4. Loose stools   5. Hemorrhoids, unspecified hemorrhoid type    The patient is hemodynamically stable.  It looks like eradication of her H. pylori has significantly improved her symptomatology from her prior GI issues.  With that being said, this recent influenza that she dealt with earlier this year has caused significant changes and alteration of her  bowel habits.  Hopefully this is just IBS postinfectious that is occurring though we should consider SIBO, EPI, C. difficile, other infections as being other potential reasons for symptoms.  We will do stool studies at this time.  Will rule out thyroid dysfunction and make sure she does not have evidence of celiac disease which is felt to be less likely.  I been asked the patient to initiate bulking fiber in an effort of trying to see if that will help with her symptoms.  The possibility of an underlying IBS/D versus microscopic/collagenous colitis still needs to be kept in mind 2.  If she continues to have symptoms at follow-up, screening colonoscopy with diagnostic intentions could also be considered.  Will see her in follow-up to see where things stand.  Hopefully the fecal calprotectin is not significantly elevated that may require an earlier colonoscopy consideration.  If she has evidence of an infection we will treat that.  In  regards to her lymph nodes of her abdomen, I would like to repeat an imaging study, but want to wait and give some time to work on her bowel habits first before reimaging.  We will discuss this at follow-up in clinic in the coming weeks all patient questions were answered to the best of my ability, and the patient agrees to the aforementioned plan of action with follow-up as indicated.   PLAN  Stool studies as outlined below Lab studies as outlined below Initiate bulking fiber once to twice daily (Metamucil or Benefiber) Consider SIBO breath testing in future Consider IBS/D empiric treatment Xifaxan Colonoscopy for screening +/- symptoms if patient still having loose bowel movements to be considered at follow-up clinic visit If hemorrhoids still bothering her at next visit, will evaluate with rectal evaluation/exam At follow-up in clinic, discuss repeat CT imaging of abdomen for abdominal lymph node evaluation   Orders Placed This Encounter  Procedures   Stool Culture   Ova and parasite examination   Clostridium difficile Toxin B, Qualitative, Real-Time PCR   CALPROTECTIN   IgA   Tissue transglutaminase, IgA   TSH   Pancreatic elastase, fecal   Fecal fat, qualitative    New Prescriptions   No medications on file   Modified Medications   No medications on file    Planned Follow Up No follow-ups on file.   Total Time in Face-to-Face and in Coordination of Care for patient including independent/personal interpretation/review of prior testing, medical history, examination, medication adjustment, communicating results with the patient directly, and documentation within the EHR is 25 minutes.   Corliss Parish, MD [facility] Advanced Endoscopy Office # [phone]

## 2024-01-28 NOTE — Patient Instructions (Addendum)
 Su proveedor le ha solicitado que vaya al stano para Education officer, environmental anlisis de laboratorio antes de irse hoy. Presione "B" en el ascensor. El laboratorio est ubicado en la primera puerta a la izquierda al salir del Medical sales representative.  Compre los siguientes medicamentos sin receta y tmelos segn las indicaciones:  Se sugiere una dieta rica en fibra con abundante lquido (hasta 8 vasos de agua al da) para Catering manager sntomas.  Se puede usar Metamucil, 1 cucharada una o dos veces al da para [employer] intestinos regulares si es necesario.  Pruebe la crema Preparation H o su crema con esteroides que tiene en casa y que le dio su mdico de atencin primaria (crema de hidrocortisona)  Hemos enviado los siguientes medicamentos a su farmacia para que los recoja cuando le convenga: Supositorios Anusol: selo todas las noches durante 3 das por la noche   Seguimiento el: 02/04/24 a las 10:40 am con Hyacinth Meeker, PA-C  ___________________________________________________________________________________________________________  Your provider has requested that you go to the basement level for lab work before leaving today. Press "B" on the elevator. The lab is located at the first door on the left as you exit the elevator.  Please purchase the following medications over the counter and take as directed:  A high fiber diet with plenty of fluids (up to 8 glasses of water daily) is suggested to relieve these symptoms.  Metamucil, 1 tablespoon once or twice daily can be used to keep bowels regular if needed.  Try Preparation H cream or your steroid cream that you have at home that was given by your primary care physician - (Hydrocortisone cream)  We have sent the following medications to your pharmacy for you to pick up at your convenience: Anusol Suppositories- use nightly for 3 days nights   Follow-up on :03/05/24 at 10:40 am with Hyacinth Meeker ,PA-C     _______________________________________________________  If your blood pressure at your visit was 140/90 or greater, please contact your primary care physician to follow up on this.  _______________________________________________________  If you are age 69 or older, your body mass index should be between 23-30. Your Body mass index is 29 kg/m. If this is out of the aforementioned range listed, please consider follow up with your Primary Care Provider.  If you are age 69 or younger, your body mass index should be between 19-25. Your Body mass index is 29 kg/m. If this is out of the aformentioned range listed, please consider follow up with your Primary Care Provider.   ________________________________________________________  The [facility] providers would like to encourage you to use [facility] to communicate with providers for non-urgent requests or questions.  Due to long hold times on the telephone, sending your provider a message by [facility] may be a faster and more efficient way to get a response.  Please allow 48 business hours for a response.  Please remember that this is for non-urgent requests.  _______________________________________________________  Thank you for choosing me and [facility].  Dr. Meridee Score

## 2024-01-29 LAB — IGA: Immunoglobulin A: 387 mg/dL — ABNORMAL HIGH (ref 70–320)

## 2024-01-29 LAB — TISSUE TRANSGLUTAMINASE, IGA: (tTG) Ab, IgA: <1 U/mL

## 2024-01-30 ENCOUNTER — Other Ambulatory Visit: Payer: Self-pay

## 2024-01-30 ENCOUNTER — Telehealth: Payer: Self-pay | Admitting: Gastroenterology

## 2024-01-30 MED ORDER — HYDROCORTISONE ACETATE 25 MG RE SUPP: 25.0000 mg | Freq: Two times a day (BID) | RECTAL | 0 refills | Status: DC

## 2024-01-30 NOTE — Telephone Encounter (Signed)
 Prescription sent to [facility] pharmacy. Sophia can you please call patient and let her know. Thank you!

## 2024-01-30 NOTE — Telephone Encounter (Signed)
 Inbound call from patient's daughter stating suppositories had not been called into the pharmacy. Would like prescription sent to [facility] on [address].

## 2024-01-31 ENCOUNTER — Other Ambulatory Visit: Payer: Medicaid Other

## 2024-01-31 DIAGNOSIS — Z8619 Personal history of other infectious and parasitic diseases: Secondary | ICD-10-CM

## 2024-01-31 DIAGNOSIS — K649 Unspecified hemorrhoids: Secondary | ICD-10-CM

## 2024-01-31 DIAGNOSIS — R194 Change in bowel habit: Secondary | ICD-10-CM

## 2024-01-31 DIAGNOSIS — R195 Other fecal abnormalities: Secondary | ICD-10-CM

## 2024-01-31 DIAGNOSIS — R599 Enlarged lymph nodes, unspecified: Secondary | ICD-10-CM

## 2024-01-31 NOTE — Telephone Encounter (Signed)
 Prescription for Anusol Suppositories was re-sent to pharmacy. Sophia L will contact patient for me to let her know that rx has been sent to pharmacy.

## 2024-01-31 NOTE — Telephone Encounter (Signed)
 Patient notified

## 2024-02-04 LAB — FECAL FAT, QUALITATIVE
Fat Qual Neutral, Stl: NORMAL
Fat Qual Total, Stl: NORMAL

## 2024-02-07 LAB — OVA AND PARASITE EXAMINATION
CONCENTRATE RESULT:: NONE SEEN
MICRO NUMBER:: 16143751
SPECIMEN QUALITY:: ADEQUATE
TRICHROME RESULT:: NONE SEEN

## 2024-02-07 LAB — CALPROTECTIN: Calprotectin: 455 ug/g — ABNORMAL HIGH

## 2024-02-07 LAB — SALMONELLA/SHIGELLA CULT, CAMPY EIA AND SHIGA TOXIN RFL ECOLI
MICRO NUMBER: 16143970
MICRO NUMBER:: 16143971
MICRO NUMBER:: 16143972
Result:: NOT DETECTED
SHIGA RESULT:: NOT DETECTED
SPECIMEN QUALITY: ADEQUATE
SPECIMEN QUALITY:: ADEQUATE
SPECIMEN QUALITY:: ADEQUATE

## 2024-02-07 LAB — PANCREATIC ELASTASE, FECAL: Pancreatic Elastase-1, Stool: >800 ug/g (ref >200)

## 2024-02-07 LAB — CLOSTRIDIUM DIFFICILE TOXIN B, QUALITATIVE, REAL-TIME PCR: Toxigenic C. Difficile by PCR: NOT DETECTED

## 2024-03-05 ENCOUNTER — Ambulatory Visit: Payer: Medicaid Other | Admitting: Physician Assistant

## 2024-03-17 NOTE — Telephone Encounter (Signed)
 ERROR

## 2024-04-15 ENCOUNTER — Other Ambulatory Visit: Payer: Medicaid Other

## 2024-04-17 ENCOUNTER — Ambulatory Visit: Payer: Medicaid Other | Admitting: "Endocrinology

## 2024-04-29 ENCOUNTER — Ambulatory Visit (INDEPENDENT_AMBULATORY_CARE_PROVIDER_SITE_OTHER): Admitting: Physician Assistant

## 2024-04-29 ENCOUNTER — Encounter: Payer: Self-pay | Admitting: Physician Assistant

## 2024-04-29 ENCOUNTER — Other Ambulatory Visit (INDEPENDENT_AMBULATORY_CARE_PROVIDER_SITE_OTHER)

## 2024-04-29 ENCOUNTER — Ambulatory Visit: Payer: Self-pay | Admitting: Physician Assistant

## 2024-04-29 VITALS — BP 112/70 | HR 60 | Ht 60.0 in | Wt 152.2 lb

## 2024-04-29 DIAGNOSIS — R935 Abnormal findings on diagnostic imaging of other abdominal regions, including retroperitoneum: Secondary | ICD-10-CM

## 2024-04-29 DIAGNOSIS — Z1211 Encounter for screening for malignant neoplasm of colon: Secondary | ICD-10-CM

## 2024-04-29 DIAGNOSIS — R194 Change in bowel habit: Secondary | ICD-10-CM

## 2024-04-29 DIAGNOSIS — R195 Other fecal abnormalities: Secondary | ICD-10-CM

## 2024-04-29 LAB — BASIC METABOLIC PANEL WITH GFR
BUN: 12 mg/dL (ref 6–23)
CO2: 28 meq/L (ref 19–32)
Calcium: 8.9 mg/dL (ref 8.4–10.5)
Chloride: 105 meq/L (ref 96–112)
Creatinine, Ser: 0.74 mg/dL (ref 0.40–1.20)
GFR: 83.03 mL/min (ref >60.00)
Glucose, Bld: 94 mg/dL (ref 70–99)
Potassium: 4 meq/L (ref 3.5–5.1)
Sodium: 139 meq/L (ref 135–145)

## 2024-04-29 MED ORDER — NA SULFATE-K SULFATE-MG SULF 17.5-3.13-1.6 GM/177ML PO SOLN: 1.0000 | Freq: Once | ORAL | 0 refills | Status: AC

## 2024-04-29 NOTE — Progress Notes (Signed)
 Please let patient know labs looked good- ok for contrasted CT.  Thanks-JLL

## 2024-04-29 NOTE — Progress Notes (Signed)
 Attending Physician's Attestation   I have reviewed the chart.   I agree with the Advanced Practitioner's note, impression, and recommendations with any updates as below.    Corliss Parish, MD [facility] Advanced [facility]oscopy Office # [phone]

## 2024-04-29 NOTE — Patient Instructions (Addendum)
 You will be contacted by [facility] Scheduling in the next 2 days to arrange a CT Abdomen/Pelvis.  The number on your caller ID will be [phone], please answer when they call.  If you have not heard from them in 2 days please call [phone] to schedule.     Your provider has requested that you go to the basement level for lab work before leaving today. Press "B" on the elevator. The lab is located at the first door on the left as you exit the elevator.   You have been scheduled for a colonoscopy. Please follow written instructions given to you at your visit today.   If you use inhalers (even only as needed), please bring them with you on the day of your procedure.  DO NOT TAKE 7 DAYS PRIOR TO TEST- Trulicity (dulaglutide) Ozempic, Wegovy (semaglutide) Mounjaro (tirzepatide) Bydureon Bcise (exanatide extended release)  DO NOT TAKE 1 DAY PRIOR TO YOUR TEST Rybelsus (semaglutide) Adlyxin (lixisenatide) Victoza (liraglutide) Byetta (exanatide) ___________________________________________________________________________  Due to recent changes in healthcare laws, you may see the results of your imaging and laboratory studies on MyChart before your provider has had a chance to review them.  We understand that in some cases there may be results that are confusing or concerning to you. Not all laboratory results come back in the same time frame and the provider may be waiting for multiple results in order to interpret others.  Please give us  48 hours in order for your provider to thoroughly review all the results before contacting the office for clarification of your results.    I appreciate the  opportunity to care for you  Thank You   Jennifer Lemmon,PA-C

## 2024-04-29 NOTE — Progress Notes (Signed)
 Chief Complaint: Follow-up change in bowel habits and hemorrhoids  HPI:    Casey Brewer is a 68 year old Hispanic speaking female with a past medical history as listed below, known to Dr. Brice Campi for history of IBS and H. pylori (status post multiple treatments with final eradication), postcholecystectomy, hemorrhoids, mesenteric lymph nodes who presents to clinic today for follow-up of a change in bowel habits and hemorrhoids.    07/24/2023 CTAP with contrast showed some prominent, borderline enlarged right lower quadrant mesenteric lymph nodes of uncertain etiology and significance.  Recommended follow-up in 3 months.    01/28/2024 patient seen in clinic by Dr. Brice Campi and at that time following up for chronic issues.  Discussed history of H. pylori infection initially with unsuccessful treatment but finally treated successfully after multiple antibiotic courses with a Rifabutin  regimen by ID.  At that time feeling better.  She was having some significant changes in her bowel habits with loose and watery diarrhea after eating meals.  Hemorrhoids exacerbated.  At that time there was a thought that this is post IBS diarrhea given recent flu infection.  Stool studies ordered.  Ruled out thyroid  dysfunction and celiac disease.  Recommend she take fiber.  Discussed possible SIBO breath testing versus IBS/D empiric treatment with Xifaxan versus colonoscopy in the future.  Recommended discussing repeat CT imaging the abdomen for abdominal lymph node evaluation at follow-up.    01/31/2024 fecal calprotectin elevated at 455 and other stool studies negative/normal.  Recommended considering colonoscopy if still with symptoms at follow-up.    Today, patient presents to clinic accompanied by her daughter and granddaughter and an interpreter.  She explains that she is doing well.  She is no longer having diarrhea but she is having a bowel movement, solid, anytime that she eats which is about 3 times a day.  Prior  to all of this she was only having 1 solid bowel movement a day.  She does not have any abdominal pain or discomfort and is not really that bothered by having more frequent bowel movements.  She does think she has lost a little bit of weight maybe 4 pounds since being seen last in February, but nothing significant.    Denies prior colon cancer screening.    Denies fever, chills, blood in her stool or symptoms that awaken her from sleep.  Past Medical History:  Diagnosis Date   Complication of anesthesia    "air escaped from hole in my nose and was in my face"   Depression    Gastritis    H. pylori infection    IBS (irritable bowel syndrome)     Past Surgical History:  Procedure Laterality Date   ABDOMINAL HYSTERECTOMY     68yo   BLADDER SURGERY     CHOLECYSTECTOMY     ESOPHAGOGASTRODUODENOSCOPY N/A 09/01/2018   Procedure: ESOPHAGOGASTRODUODENOSCOPY (EGD);  Surgeon: Normie Becton., MD;  Location[facility] ENDOSCOPY;  Service: Gastroenterology;  Laterality: N/A;   EUS N/A 09/01/2018   Procedure: UPPER ENDOSCOPIC ULTRASOUND (EUS) LINEAR;  Surgeon: Normie Becton., MD;  Location: [facility] ENDOSCOPY;  Service: Gastroenterology;  Laterality: N/A;   THROAT SURGERY      Current Outpatient Medications  Medication Sig Dispense Refill   alendronate (FOSAMAX) 70 MG tablet Take 70 mg by mouth once a week.     Calcium Carbonate-Vitamin D  (CALCIUM-VITAMIN D  PO) Take 1 capsule by mouth daily.     Cyanocobalamin (VITAMIN B12 PO) Take 1 tablet by mouth daily.  hydrocortisone  (ANUSOL -HC) 25 MG suppository Place 1 suppository (25 mg total) rectally every 12 (twelve) hours. 12 suppository 0   No current facility-administered medications for this visit.    Allergies as of 04/29/2024 - Review Complete 01/28/2024  Allergen Reaction Noted   Dipyrone Palpitations 07/31/2018    Family History  Problem Relation Age of Onset   Heart disease Mother    Healthy Father    Liver disease Neg Hx     Esophageal cancer Neg Hx    Colon cancer Neg Hx    Inflammatory bowel disease Neg Hx    Pancreatic cancer Neg Hx    Rectal cancer Neg Hx    Stomach cancer Neg Hx     Social History   Socioeconomic History   Marital status: Widowed    Spouse name: Not on file   Number of children: 7   Years of education: Not on file   Highest education level: Not on file  Occupational History   Occupation: retired  Tobacco Use   Smoking status: Never    Passive exposure: Never   Smokeless tobacco: Never  Vaping Use   Vaping status: Never Used  Substance and Sexual Activity   Alcohol use: No    Alcohol/week: 0.0 standard drinks of alcohol   Drug use: Not Currently   Sexual activity: Not Currently    Partners: Male    Birth control/protection: Post-menopausal, Surgical    Comment: Hysterectomy  Other Topics Concern   Not on file  Social History Narrative   ** Merged History Encounter **       Social Drivers of Corporate investment banker Strain: Not on file  Food Insecurity: Not on file  Transportation Needs: Not on file  Physical Activity: Not on file  Stress: Not on file  Social Connections: Not on file  Intimate Partner Violence: Not on file    Review of Systems:    Constitutional: No weight loss, fever or chills Cardiovascular: No chest pain  Respiratory: No SOB Gastrointestinal: See HPI and otherwise negative   Physical Exam:  Vital signs: BP 112/70 (BP Location: Left Arm, Patient Position: Sitting, Cuff Size: Normal)   Pulse 60   Ht 5' (1.524 m)   Wt 152 lb 4 oz (69.1 kg)   BMI 29.73 kg/m    Constitutional:   Pleasant Hispanic female appears to be in NAD, Well developed, Well nourished, alert and cooperative Respiratory: Respirations even and unlabored. Lungs clear to auscultation bilaterally.   No wheezes, crackles, or rhonchi.  Cardiovascular: Normal S1, S2. No MRG. Regular rate and rhythm. No peripheral edema, cyanosis or pallor.  Gastrointestinal:  Soft,  nondistended, nontender. No rebound or guarding. Normal bowel sounds. No appreciable masses or hepatomegaly. Rectal:  Not performed.  Psychiatric: Demonstrates good judgement and reason without abnormal affect or behaviors.  RELEVANT LABS AND IMAGING: CBC    Component Value Date/Time   WBC 9.7 12/25/2023 1517   RBC 4.87 12/25/2023 1517   HGB 14.1 12/25/2023 1517   HGB 14.0 04/22/2018 1624   HCT 43.7 12/25/2023 1517   HCT 41.3 04/22/2018 1624   PLT 344 12/25/2023 1517   PLT 301 04/22/2018 1624   MCV 89.7 12/25/2023 1517   MCV 85 04/22/2018 1624   MCH 29.0 12/25/2023 1517   MCHC 32.3 12/25/2023 1517   RDW 14.3 12/25/2023 1517   RDW 14.4 04/22/2018 1624   LYMPHSABS 1.7 12/22/2023 1441   LYMPHSABS 2.9 04/22/2018 1624   MONOABS 0.4 12/22/2023 1441  EOSABS 0.0 12/22/2023 1441   EOSABS 0.3 04/22/2018 1624   BASOSABS 0.0 12/22/2023 1441   BASOSABS 0.1 04/22/2018 1624    CMP     Component Value Date/Time   NA 136 12/25/2023 1517   NA 141 04/22/2018 1624   K 3.2 (L) 12/25/2023 1517   CL 108 12/25/2023 1517   CO2 20 (L) 12/25/2023 1517   GLUCOSE 108 (H) 12/25/2023 1517   BUN 8 12/25/2023 1517   BUN 12 04/22/2018 1624   CREATININE 0.51 12/25/2023 1517   CREATININE 0.76 09/26/2023 1147   CALCIUM 9.0 12/25/2023 1517   PROT 7.6 12/25/2023 1517   PROT 7.2 04/22/2018 1624   ALBUMIN 3.8 12/25/2023 1517   ALBUMIN 4.4 04/22/2018 1624   AST 25 12/25/2023 1517   ALT 33 12/25/2023 1517   ALKPHOS 58 12/25/2023 1517   BILITOT 1.0 12/25/2023 1517   BILITOT 0.6 04/22/2018 1624   GFRNONAA >60 12/25/2023 1517   GFRAA 93 04/22/2018 1624    Assessment: 1.  Change in bowel habits: Initially with diarrhea back in February, full workup at the time with stool studies, celiac studies and thyroid  testing, fecal calprotectin was minimally elevated, but patient's symptoms have gone away, now with 3 solid bowel movements a day 2.  Screening for colorectal cancer: Patient is 80 never had  screening for colon cancer 3.  Abnormal CT of the abdomen: With lymph nodes present in the right lower quadrant previously, will recheck 4.  Prior elevated fecal calprotectin: During episodes of diarrhea, will recheck to trend  Plan: 1.  Per recommendations from Dr. Brice Campi and radiology team will repeat CT of the abdomen and pelvis given lymph nodes found at time of last CT. 2.  Recheck fecal calprotectin due to it being elevated previously 3.  Scheduled the patient for screening colonoscopy in the [facility] with Dr. Brice Campi.  Did provide the patient with a detailed list of risks for the procedure and she agrees to proceed. Patient is appropriate for endoscopic procedure(s) in the ambulatory ([facility]) setting.  4.  Patient to follow in clinic per recommendation from Dr. Brice Campi after time of procedure.  Reginal Capra, PA-C [facility] 04/29/2024, 10:09 AM  Cc: Ruel Cotta, Heinz Llano, MD

## 2024-05-01 ENCOUNTER — Ambulatory Visit (HOSPITAL_COMMUNITY)
Admission: RE | Admit: 2024-05-01 | Discharge: 2024-05-01 | Disposition: A | Discharge status: Home / Self Care | Source: Ambulatory Visit | Attending: Physician Assistant | Admitting: Physician Assistant

## 2024-05-01 ENCOUNTER — Other Ambulatory Visit

## 2024-05-01 DIAGNOSIS — R935 Abnormal findings on diagnostic imaging of other abdominal regions, including retroperitoneum: Secondary | ICD-10-CM | POA: Insufficient documentation

## 2024-05-01 DIAGNOSIS — R195 Other fecal abnormalities: Secondary | ICD-10-CM

## 2024-05-01 MED ORDER — IOHEXOL 300 MG/ML  SOLN
100.0000 mL | Freq: Once | INTRAMUSCULAR | Status: AC | PRN
  Administered 2024-05-01: 100 mL via INTRAVENOUS

## 2024-05-04 LAB — CALPROTECTIN, FECAL: Calprotectin, Fecal: 22 ug/g (ref 0–120)

## 2024-05-05 ENCOUNTER — Encounter: Payer: Self-pay | Admitting: Gastroenterology

## 2024-05-05 ENCOUNTER — Ambulatory Visit: Admitting: Gastroenterology

## 2024-05-05 VITALS — BP 119/69 | HR 66 | Temp 97.7°F | Resp 18 | Ht 60.0 in | Wt 152.0 lb

## 2024-05-05 DIAGNOSIS — Z1211 Encounter for screening for malignant neoplasm of colon: Secondary | ICD-10-CM

## 2024-05-05 DIAGNOSIS — K5730 Diverticulosis of large intestine without perforation or abscess without bleeding: Secondary | ICD-10-CM

## 2024-05-05 DIAGNOSIS — R195 Other fecal abnormalities: Secondary | ICD-10-CM

## 2024-05-05 DIAGNOSIS — K641 Second degree hemorrhoids: Secondary | ICD-10-CM | POA: Diagnosis not present

## 2024-05-05 DIAGNOSIS — K562 Volvulus: Secondary | ICD-10-CM

## 2024-05-05 DIAGNOSIS — K644 Residual hemorrhoidal skin tags: Secondary | ICD-10-CM

## 2024-05-05 MED ORDER — SODIUM CHLORIDE 0.9 % IV SOLN: 500.0000 mL | Freq: Once | INTRAVENOUS | Status: AC

## 2024-05-05 NOTE — Progress Notes (Signed)
 GASTROENTEROLOGY PROCEDURE H&P NOTE   Primary Care Physician: Ruel Cotta, Heinz Llano, MD  HPI: Casey Brewer is a 68 y.o. female who presents for Colonoscopy for screening and for change in bowel habits and elevated fecal calprotectin.  Past Medical History:  Diagnosis Date   Anxiety    Complication of anesthesia    "air escaped from hole in my nose and was in my face"   Depression    Gastritis    H. pylori infection    IBS (irritable bowel syndrome)    Osteoporosis    Past Surgical History:  Procedure Laterality Date   ABDOMINAL HYSTERECTOMY     68yo   BLADDER SURGERY     CHOLECYSTECTOMY     ESOPHAGOGASTRODUODENOSCOPY N/A 09/01/2018   Procedure: ESOPHAGOGASTRODUODENOSCOPY (EGD);  Surgeon: Normie Becton., MD;  Location: [facility] ENDOSCOPY;  Service: Gastroenterology;  Laterality: N/A;   EUS N/A 09/01/2018   Procedure: UPPER ENDOSCOPIC ULTRASOUND (EUS) LINEAR;  Surgeon: Normie Becton., MD;  Location: [facility] ENDOSCOPY;  Service: Gastroenterology;  Laterality: N/A;   THROAT SURGERY     UPPER GASTROINTESTINAL ENDOSCOPY     Current Outpatient Medications  Medication Sig Dispense Refill   alendronate (FOSAMAX) 70 MG tablet Take 70 mg by mouth once a week.     Calcium Carbonate-Vitamin D  (CALCIUM-VITAMIN D  PO) Take 1 capsule by mouth daily.     Cyanocobalamin (VITAMIN B12 PO) Take 1 tablet by mouth daily.     PARoxetine (PAXIL) 20 MG tablet Take 10 mg by mouth daily.     Current Facility-Administered Medications  Medication Dose Route Frequency Provider Last Rate Last Admin   0.9 %  sodium chloride  infusion  500 mL Intravenous Once Mansouraty, Claire Bridge Jr., MD        Current Outpatient Medications:    alendronate (FOSAMAX) 70 MG tablet, Take 70 mg by mouth once a week., Disp: , Rfl:    Calcium Carbonate-Vitamin D  (CALCIUM-VITAMIN D  PO), Take 1 capsule by mouth daily., Disp: , Rfl:    Cyanocobalamin (VITAMIN B12 PO), Take 1 tablet by mouth daily., Disp: , Rfl:     PARoxetine (PAXIL) 20 MG tablet, Take 10 mg by mouth daily., Disp: , Rfl:   Current Facility-Administered Medications:    0.9 %  sodium chloride  infusion, 500 mL, Intravenous, Once, Mansouraty, Albino Alu., MD Allergies  Allergen Reactions   Dipyrone Palpitations   Family History  Problem Relation Age of Onset   Heart disease Mother    Healthy Father    Liver disease Neg Hx    Esophageal cancer Neg Hx    Colon cancer Neg Hx    Inflammatory bowel disease Neg Hx    Pancreatic cancer Neg Hx    Rectal cancer Neg Hx    Stomach cancer Neg Hx    Social History   Socioeconomic History   Marital status: Widowed    Spouse name: Not on file   Number of children: 7   Years of education: Not on file   Highest education level: Not on file  Occupational History   Occupation: retired  Tobacco Use   Smoking status: Never    Passive exposure: Never   Smokeless tobacco: Never  Vaping Use   Vaping status: Never Used  Substance and Sexual Activity   Alcohol use: No    Alcohol/week: 0.0 standard drinks of alcohol   Drug use: Not Currently   Sexual activity: Not Currently    Partners: Male    Birth control/protection: Post-menopausal, Surgical  Comment: Hysterectomy  Other Topics Concern   Not on file  Social History Narrative   ** Merged History Encounter **       Social Drivers of Corporate investment banker Strain: Not on file  Food Insecurity: Not on file  Transportation Needs: Not on file  Physical Activity: Not on file  Stress: Not on file  Social Connections: Not on file  Intimate Partner Violence: Not on file    Physical Exam: Today's Vitals   05/05/24 1011  BP: 138/76  Pulse: 64  Temp: 97.7 F (36.5 C)  TempSrc: Temporal  SpO2: 97%  Weight: 152 lb (68.9 kg)  Height: 5' (1.524 m)   Body mass index is 29.69 kg/m. GEN: NAD EYE: Sclerae anicteric ENT: MMM CV: Non-tachycardic GI: Soft, NT/ND NEURO:  Alert & Oriented x 3  Lab Results: No results for  input(s): "WBC", "HGB", "HCT", "PLT" in the last 72 hours. BMET No results for input(s): "NA", "K", "CL", "CO2", "GLUCOSE", "BUN", "CREATININE", "CALCIUM" in the last 72 hours. LFT No results for input(s): "PROT", "ALBUMIN", "AST", "ALT", "ALKPHOS", "BILITOT", "BILIDIR", "IBILI" in the last 72 hours. PT/INR No results for input(s): "LABPROT", "INR" in the last 72 hours.   Impression / Plan: This is a 68 y.o.female who presents for Colonoscopy for screening and for change in bowel habits and elevated fecal calprotectin.  The risks and benefits of endoscopic evaluation/treatment were discussed with the patient and/or family; these include but are not limited to the risk of perforation, infection, bleeding, missed lesions, lack of diagnosis, severe illness requiring hospitalization, as well as anesthesia and sedation related illnesses.  The patient's history has been reviewed, patient examined, no change in status, and deemed stable for procedure.  The patient and/or family is agreeable to proceed.    [name], MD [facility]oscopy Office # [phone]

## 2024-05-05 NOTE — Progress Notes (Signed)
 Updated medical record.

## 2024-05-05 NOTE — Progress Notes (Signed)
 Please let patient know fecal calprotectin is normal.  Thanks- JLL

## 2024-05-05 NOTE — Progress Notes (Signed)
 Called to room to assist during endoscopic procedure.  Patient ID and intended procedure confirmed with present staff. Received instructions for my participation in the procedure from the performing physician.

## 2024-05-05 NOTE — Patient Instructions (Signed)

## 2024-05-05 NOTE — Op Note (Signed)
 [facility] Patient Name: Casey Brewer Procedure Date: 05/05/2024 10:41 AM MRN: [id number] Endoscopist: [name] , MD, [id number] Age: 68 Referring MD:  Date of Birth: [birth year] Gender: Female Account #: [id number] Procedure:                Colonoscopy Indications:              Screening for colorectal malignant neoplasm,                            Incidental change in bowel habits noted, Incidental                            diarrhea noted and elevated fecal calprotectin Medicines:                Monitored Anesthesia Care Procedure:                Pre-Anesthesia Assessment:                           - Prior to the procedure, a History and Physical                            was performed, and patient medications and                            allergies were reviewed. The patient's tolerance of                            previous anesthesia was also reviewed. The risks                            and benefits of the procedure and the sedation                            options and risks were discussed with the patient.                            All questions were answered, and informed consent                            was obtained. Prior Anticoagulants: The patient has                            taken no anticoagulant or antiplatelet agents. ASA                            Grade Assessment: II - A patient with mild systemic                            disease. After reviewing the risks and benefits,                            the patient was deemed in satisfactory condition to  undergo the procedure.                           After obtaining informed consent, the colonoscope                            was passed under direct vision. Throughout the                            procedure, the patient's blood pressure, pulse, and                            oxygen saturations were monitored continuously. The                             Olympus Scope [id number] was introduced through the                            anus and advanced to the the cecum, identified by                            appendiceal orifice and ileocecal valve. The                            colonoscopy was performed without difficulty. The                            patient tolerated the procedure. The quality of the                            bowel preparation was good. The ileocecal valve,                            appendiceal orifice, and rectum were photographed. Scope In: 10:46:33 AM Scope Out: 10:59:09 AM Scope Withdrawal Time: 0 hours 10 minutes 8 seconds  Total Procedure Duration: 0 hours 12 minutes 36 seconds  Findings:                 The digital rectal exam findings include                            hemorrhoids. Pertinent negatives include no                            palpable rectal lesions.                           The colon (entire examined portion) revealed                            moderately excessive looping.                           A few small-mouthed diverticula were found in the  recto-sigmoid colon and sigmoid colon.                           Normal mucosa was found in the entire colon.                            Biopsies for histology were taken with a cold                            forceps from the entire colon for evaluation of                            microscopic colitis.                           Non-bleeding non-thrombosed internal hemorrhoids                            were found during retroflexion, during perianal                            exam and during digital exam. The hemorrhoids were                            Grade II (internal hemorrhoids that prolapse but                            reduce spontaneously). Complications:            No immediate complications. Estimated Blood Loss:     Estimated blood loss was minimal. Impression:               - Hemorrhoids found on digital  rectal exam.                           - There was significant looping of the colon.                           - Diverticulosis in the recto-sigmoid colon and in                            the sigmoid colon.                           - Normal mucosa in the entire examined colon.                            Biopsied.                           - Non-bleeding non-thrombosed internal hemorrhoids. Recommendation:           - The patient will be observed post-procedure,                            until all discharge criteria are met.                           -  Discharge patient to home.                           - Patient has a contact number available for                            emergencies. The signs and symptoms of potential                            delayed complications were discussed with the                            patient. Return to normal activities tomorrow.                            Written discharge instructions were provided to the                            patient.                           - High fiber diet.                           - Use FiberCon 1-2 tablets PO daily.                           - Continue present medications.                           - Await pathology results.                           - Repeat colonoscopy in 10 years for screening                            purposes.                           - Further workup based on pathology. Consider SIBO                            breath testing and IBS-D evaluation/treatment if                            recurrent significant diarrheal symptoms occur.                           - The findings and recommendations were discussed                            with the patient.                           - The findings and recommendations were discussed  with the patient's family. [name], MD 05/05/2024 11:14:14 AM

## 2024-05-05 NOTE — Progress Notes (Signed)
Vs nad trans to pacu

## 2024-05-06 ENCOUNTER — Telehealth: Payer: Self-pay | Admitting: *Deleted

## 2024-05-06 NOTE — Telephone Encounter (Signed)
  Follow up Call-     05/05/2024   10:11 AM  Call back number  Post procedure Call Back phone  # Shelagh Derrick [phone]  Permission to leave phone message Yes     Patient questions:  Do you have a fever, pain , or abdominal swelling? No. Pain Score  0 *  Have you tolerated food without any problems? Yes.    Have you been able to return to your normal activities? Yes.    Do you have any questions about your discharge instructions: Diet   No. Medications  No. Follow up visit  No.  Do you have questions or concerns about your Care? No.  Actions: * If pain score is 4 or above: No action needed, pain <4.  [employer] used today at the [facility] for the post procedure call for this pt.

## 2024-05-08 LAB — SURGICAL PATHOLOGY

## 2024-05-10 ENCOUNTER — Ambulatory Visit: Payer: Self-pay | Admitting: Gastroenterology

## 2024-06-15 ENCOUNTER — Encounter: Payer: Self-pay | Admitting: "Endocrinology

## 2024-06-15 ENCOUNTER — Ambulatory Visit (INDEPENDENT_AMBULATORY_CARE_PROVIDER_SITE_OTHER): Admitting: "Endocrinology

## 2024-06-15 VITALS — BP 120/80 | HR 85 | Ht 60.0 in | Wt 154.0 lb

## 2024-06-15 DIAGNOSIS — M810 Age-related osteoporosis without current pathological fracture: Secondary | ICD-10-CM

## 2024-06-15 LAB — RENAL FUNCTION PANEL
Albumin: 4.2 g/dL (ref 3.6–5.1)
BUN: 14 mg/dL (ref 7–25)
CO2: 28 mmol/L (ref 20–32)
Calcium: 9.8 mg/dL (ref 8.6–10.4)
Chloride: 102 mmol/L (ref 98–110)
Creat: 0.72 mg/dL (ref 0.50–1.05)
Glucose, Bld: 87 mg/dL (ref 65–99)
Phosphorus: 3.6 mg/dL (ref 2.1–4.3)
Potassium: 4.5 mmol/L (ref 3.5–5.3)
Sodium: 138 mmol/L (ref 135–146)

## 2024-06-15 LAB — VITAMIN D 25 HYDROXY (VIT D DEFICIENCY, FRACTURES): Vit D, 25-Hydroxy: 44 ng/mL (ref 30–100)

## 2024-06-15 NOTE — Progress Notes (Signed)
 [facility] Note Obadiah Birmingham, MD    Referring Provider: Dallie Vera GAILS, MD Primary Care Provider: Nikki Rams, Aliene, MD No chief complaint on file.    Assessment & Plan  Diagnoses and all orders for this visit:  Age-related osteoporosis without current pathological fracture -     VITAMIN D  25 Hydroxy (Vit-D Deficiency, Fractures) -     Renal function panel     Osteoporosis Likely secondary cause from age (and possibly from earlier salpino-oophorectomy? History unknown, docotr who surgery in [place] pased away.  Per records, last DXA: 10/232024, spine T-2.6, L femur -1.3, L hip -0.2, L radius -3.4. She is currently taking MVI (2 gummies) + Calcium 1200 with Vit D 1000 IU once daily and vitamin D  400 IU once daily. Discussed weight bearing exercise options and dietary supplements. Will evaluate for secondary causes of osteoporosis.  Started fosamax around 2025. No current contraindications. Educated on risks and side effects of fosamax including but not limited to esophagitis, worsening GERD, atypical femoral fractures and osteonecrosis of the jaw. Advised to take medication first thing in the morning with plenty of water and stay upright for 30 minutes after taking the medication.  Advised fall precautions, adequate dairy in diet and exercises (aerobic, balancing and weight bearing) as tolerated.   Return in about 6 months (around 12/16/2024).  I have reviewed current medications, nurse's notes, allergies, vital signs, past medical and surgical history, family medical history, and social history for this encounter. Counseled patient on symptoms, examination findings, lab findings, imaging results, treatment decisions and monitoring and prognosis. The patient understood the recommendations and agrees with the treatment plan. All questions regarding treatment plan were fully answered.   Obadiah Birmingham, MD   06/15/24    History of Present Illness Casey Brewer is a 68 y.o. year old female who presents to our clinic with osteoporosis diagnosed in 2024.  Per records, last DXA: 10/232024, spine T-2.6, L femur -1.3, L hip -0.2, L radius -3.4 (no report available), suggested osteoporosis. She is currently taking MVI (2 gummies) + Calcium 1200 with Vit D 1000 IU once daily and vitamin D  400 IU once daily.  Risk Factors screening:  History of low trauma fractures: No Family history of osteoporosis: No Hip fracture in first-degree relatives: No Smoking history: No Excessive alcohol intake >2 drinks/day: No Excessive caffeine intake >2 drinks/day: No Glucocorticoid use >5mg  prednisone /day for >3 months: No Rheumatoid arthritis history: No Premature/Surgical Menopause: Yes, at 41, unclear about ovaries, done in [place] for early stage cancer   Anti-epileptic drugs No  Celiac disease/signs of malabsorption No  Gastric bypass/gastrectomy No  PPI use No  TZD use No  Gonadotropin/androgen suppressing drugs No  Aromatase Inhibitors No  Thyroid  hormone suppressive therapy No  [facility]: EXAM: DUAL X-RAY ABSORPTIOMETRY (DXA) FOR BONE MINERAL DENSITY 09/25/2023 1:07 pm  CLINICAL DATA: 68 year old Female Postmenopausal. Encounter for screening for osteoporosis  TECHNIQUE: An axial (e.g., hips, spine) and/or appendicular (e.g., radius) exam was performed, as appropriate, using Armed forces technical officer at [employer]. Images are obtained for bone mineral density measurement and are not obtained for diagnostic purposes. [id number]  Trabecular Bone Score (TBS) was derived from the texture of the DXA image of the spine. TBS has been shown to be related to bone microarchitecture and fracture risk. It provides information independent of bone mineral density and can be used to modify FRAX score.  Exclusions: None.  COMPARISON: None.  FINDINGS: Scan  quality: Good.  LUMBAR SPINE (L1-L4):  BMD (in g/cm2): 0.756  T-score:  -2.6  Z-score: -0.7  LEFT FEMORAL NECK:  BMD (in g/cm2): 0.710  T-score: -1.3  Z-score: 0.2  LEFT TOTAL HIP:  BMD (in g/cm2): 0.920  T-score: -0.2  Z-score: 1.0  LEFT FOREARM (RADIUS 33%):  BMD (in g/cm2): 0.489  T-score: -3.4  Z-score: -1.5  FRAX 10-YEAR PROBABILITY OF FRACTURE:  FRAX not reported as the lowest BMD is not in the osteopenia range.  TRABECULAR BONE SCORE (TBS): L1-L4= 1.182  The lumbar spine TBS: Degraded microarchitecture.  IMPRESSION: Osteoporosis based on BMD.  Fracture risk is increased. Increased risk is based on low BMD.  RECOMMENDATIONS: 1. All patients should optimize calcium and vitamin D  intake.  2. Consider FDA-approved medical therapies in postmenopausal women and men aged 7 years and older, based on the following:  - A hip or vertebral (clinical or morphometric) fracture  - T-score less than or equal to -2.5 and secondary causes have been excluded.  - Low bone mass (T-score between -1.0 and -2.5) and a 10-year probability of a hip fracture greater than or equal to 3% or a 10-year probability of a major osteoporosis-related fracture greater than or equal to 20% based on the [place] -adapted WHO algorithm.  - Clinician judgment and/or patient preferences may indicate treatment for people with 10-year fracture probabilities above or below these levels  3. Patients with diagnosis of osteoporosis or at high risk for fracture should have regular bone mineral density tests. For patients eligible for Medicare, routine testing is allowed once every 2 years. The testing frequency can be increased to one year for patients who have rapidly progressing disease, those who are receiving or discontinuing medical therapy to restore bone mass, or have additional risk factors.    Physical Exam  BP 120/80   Pulse 85   Ht 5' (1.524 m)   Wt 154 lb (69.9 kg)   SpO2 98%   BMI 30.08 kg/m  Constitutional: well developed, well  nourished Head: normocephalic, atraumatic Eyes: sclera anicteric, no redness Neck: supple Lungs: normal respiratory effort Neurology: alert and oriented Skin: dry, no appreciable rashes Musculoskeletal: no appreciable defects Psychiatric: normal mood and affect  Allergies Allergies  Allergen Reactions   Dipyrone Palpitations    Current Medications Patient's Medications  New Prescriptions   No medications on file  Previous Medications   ALENDRONATE (FOSAMAX) 70 MG TABLET    Take 70 mg by mouth once a week.   CALCIUM CARBONATE-VITAMIN D  (CALCIUM-VITAMIN D  PO)    Take 1 capsule by mouth daily.   CYANOCOBALAMIN (VITAMIN B12 PO)    Take 1 tablet by mouth daily.   PAROXETINE (PAXIL) 20 MG TABLET    Take 10 mg by mouth daily.  Modified Medications   No medications on file  Discontinued Medications   No medications on file     Past Medical History Past Medical History:  Diagnosis Date   Anxiety    Complication of anesthesia    air escaped from hole in my nose and was in my face   Depression    Gastritis    H. pylori infection    IBS (irritable bowel syndrome)    Osteoporosis     Past Surgical History Past Surgical History:  Procedure Laterality Date   ABDOMINAL HYSTERECTOMY     68yo   BLADDER SURGERY     CHOLECYSTECTOMY     ESOPHAGOGASTRODUODENOSCOPY N/A 09/01/2018   Procedure: ESOPHAGOGASTRODUODENOSCOPY (EGD);  Surgeon: Wilhelmenia Roers  Mickey., MD;  Location[facility] ENDOSCOPY;  Service: Gastroenterology;  Laterality: N/A;   EUS N/A 09/01/2018   Procedure: UPPER ENDOSCOPIC ULTRASOUND (EUS) LINEAR;  Surgeon: Wilhelmenia Aloha Mickey., MD;  Location: [facility] ENDOSCOPY;  Service: Gastroenterology;  Laterality: N/A;   THROAT SURGERY     UPPER GASTROINTESTINAL ENDOSCOPY      Family History family history includes Healthy in her father; Heart disease in her mother.  Social History Social History   Socioeconomic History   Marital status: Widowed    Spouse name: Not on file    Number of children: 7   Years of education: Not on file   Highest education level: Not on file  Occupational History   Occupation: retired  Tobacco Use   Smoking status: Never    Passive exposure: Never   Smokeless tobacco: Never  Vaping Use   Vaping status: Never Used  Substance and Sexual Activity   Alcohol use: No    Alcohol/week: 0.0 standard drinks of alcohol   Drug use: Not Currently   Sexual activity: Not Currently    Partners: Male    Birth control/protection: Post-menopausal, Surgical    Comment: Hysterectomy  Other Topics Concern   Not on file  Social History Narrative   ** Merged History Encounter **       Social Drivers of Corporate investment banker Strain: Not on file  Food Insecurity: Not on file  Transportation Needs: Not on file  Physical Activity: Not on file  Stress: Not on file  Social Connections: Not on file  Intimate Partner Violence: Not on file    Laboratory Investigations No components found for: CMP No components found for: BMP Lab Results  Component Value Date   GFR 83.03 04/29/2024   Lab Results  Component Value Date   CREATININE 0.74 04/29/2024   No results found for: CBC No components found for: LFT No components found for: VITD Lab Results  Component Value Date   PTH 39 09/26/2023    Lab Results  Component Value Date   TSH 2.05 01/28/2024    No components found for: RENAL FUNCTION No components found for: MAGNESIUM  Parts of this note may have been dictated using voice recognition software. There may be variances in spelling and vocabulary which are unintentional. Not all errors are proofread. Please notify the dino if any discrepancies are noted or if the meaning of any statement is not clear.

## 2024-06-22 NOTE — Progress Notes (Incomplete)
 68 y.o. 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 Widowed.  She reports hx of stage 1 cancer of reproductive tract in her 52s. She had a subsequent hysterectomy in [place] and was followed with annual PAPs. She denies abnormal PAP following hysterectomy.  Urine sample provided: ***  Postmenopausal bleeding: *** Pelvic discharge or pain: *** Breast mass, nipple discharge or skin changes : *** Sexually active: ***   Last PAP:  July 2024 HPV neg, cytology unsatisfactory  No results found for: DIAGPAP, HPVHIGH, ADEQPAP Last mammogram: 10/02/2023 BI-RADS 1, density B Last DXA: 09/25/2023 T-score -2.6 at spine and -3.4 at left forearm, started on fosamax, referred to endocrine Last colonoscopy: 05/05/24 10 year recall  Exercising: *** Smoker:***  Flowsheet Row Office Visit from 10/15/2023 in [facility]  PHQ-2 Total Score 0       GYN HISTORY: ***  OB History  Gravida Para Term Preterm AB Living  7 7    7   SAB IAB Ectopic Multiple Live Births          # Outcome Date GA Lbr Len/2nd Weight Sex Type Anes PTL Lv  7 Para           6 Para           5 Para           4 Para           3 Para           2 Para           1 Para            Past Medical History:  Diagnosis Date   Anxiety    Complication of anesthesia    air escaped from hole in my nose and was in my face   Depression    Gastritis    H. pylori infection    IBS (irritable bowel syndrome)    Osteoporosis    Past Surgical History:  Procedure Laterality Date   ABDOMINAL HYSTERECTOMY     68yo   BLADDER SURGERY     CHOLECYSTECTOMY     ESOPHAGOGASTRODUODENOSCOPY N/A 09/01/2018   Procedure: ESOPHAGOGASTRODUODENOSCOPY (EGD);  Surgeon: Wilhelmenia Aloha Raddle., MD;  Location: [facility] ENDOSCOPY;  Service: Gastroenterology;  Laterality: N/A;   EUS N/A 09/01/2018    Procedure: UPPER ENDOSCOPIC ULTRASOUND (EUS) LINEAR;  Surgeon: Wilhelmenia Aloha Raddle., MD;  Location: [facility] ENDOSCOPY;  Service: Gastroenterology;  Laterality: N/A;   THROAT SURGERY     UPPER GASTROINTESTINAL ENDOSCOPY     Current Outpatient Medications on File Prior to Visit  Medication Sig Dispense Refill   alendronate (FOSAMAX) 70 MG tablet Take 70 mg by mouth once a week.     Calcium Carbonate-Vitamin D  (CALCIUM-VITAMIN D  PO) Take 1 capsule by mouth daily.     Cyanocobalamin (VITAMIN B12 PO) Take 1 tablet by mouth daily.     PARoxetine (PAXIL) 20 MG tablet Take 10 mg by mouth daily.     Current Facility-Administered Medications on File Prior to Visit  Medication Dose Route Frequency Provider Last Rate Last Admin   0.9 %  sodium chloride  infusion  500 mL Intravenous Once Mansouraty, Aloha Raddle., MD       Social History   Socioeconomic History   Marital status: Widowed    Spouse name: Not on file  Number of children: 7   Years of education: Not on file   Highest education level: Not on file  Occupational History   Occupation: retired  Tobacco Use   Smoking status: Never    Passive exposure: Never   Smokeless tobacco: Never  Vaping Use   Vaping status: Never Used  Substance and Sexual Activity   Alcohol use: No    Alcohol/week: 0.0 standard drinks of alcohol   Drug use: Not Currently   Sexual activity: Not Currently    Partners: Male    Birth control/protection: Post-menopausal, Surgical    Comment: Hysterectomy  Other Topics Concern   Not on file  Social History Narrative   ** Merged History Encounter **       Social Drivers of Corporate investment banker Strain: Not on file  Food Insecurity: Not on file  Transportation Needs: Not on file  Physical Activity: Not on file  Stress: Not on file  Social Connections: Not on file  Intimate Partner Violence: Not on file   Family History  Problem Relation Age of Onset   Heart disease Mother    Healthy Father     Liver disease Neg Hx    Esophageal cancer Neg Hx    Colon cancer Neg Hx    Inflammatory bowel disease Neg Hx    Pancreatic cancer Neg Hx    Rectal cancer Neg Hx    Stomach cancer Neg Hx    Allergies  Allergen Reactions   Dipyrone Palpitations      PE There were no vitals filed for this visit. There is no height or weight on file to calculate BMI.  Physical Exam Vitals reviewed. Exam conducted with a chaperone present.  Constitutional:      General: She is not in acute distress.    Appearance: Normal appearance.  HENT:     Head: Normocephalic and atraumatic.     Nose: Nose normal.  Eyes:     Extraocular Movements: Extraocular movements intact.     Conjunctiva/sclera: Conjunctivae normal.  Neck:     Thyroid : No thyroid  mass, thyromegaly or thyroid  tenderness.  Pulmonary:     Effort: Pulmonary effort is normal.  Chest:     Chest wall: No mass or tenderness.  Breasts:    Right: Normal. No swelling, mass, nipple discharge or tenderness.     Left: Normal. No swelling, mass, nipple discharge or tenderness.  Abdominal:     General: There is no distension.     Palpations: Abdomen is soft.     Tenderness: There is no abdominal tenderness.  Genitourinary:    General: Normal vulva.     Exam position: Lithotomy position.     Urethra: No prolapse.     Vagina: Normal. No vaginal discharge or bleeding.     Cervix: No lesion.     Adnexa: Right adnexa normal and left adnexa normal.     Comments: Cervix and uterus absent Musculoskeletal:        General: Normal range of motion.     Cervical back: Normal range of motion.  Lymphadenopathy:     Upper Body:     Right upper body: No axillary adenopathy.     Left upper body: No axillary adenopathy.     Lower Body: No right inguinal adenopathy. No left inguinal adenopathy.  Skin:    General: Skin is warm and dry.  Neurological:     General: No focal deficit present.     Mental Status: She is  alert.  Psychiatric:        Mood and  Affect: Mood normal.        Behavior: Behavior normal.       Assessment and Plan:        There are no diagnoses linked to this encounter. Clotilda FORBES Pa, CMA

## 2024-06-23 ENCOUNTER — Ambulatory Visit: Admitting: "Endocrinology

## 2024-06-24 ENCOUNTER — Ambulatory Visit: Payer: Medicaid Other | Admitting: Obstetrics and Gynecology

## 2024-11-28 ENCOUNTER — Ambulatory Visit: Admission: EM | Admit: 2024-11-28 | Discharge: 2024-11-28 | Disposition: A | Discharge status: Home / Self Care

## 2024-11-28 ENCOUNTER — Encounter: Payer: Self-pay | Admitting: *Deleted

## 2024-11-28 DIAGNOSIS — J020 Streptococcal pharyngitis: Secondary | ICD-10-CM | POA: Diagnosis not present

## 2024-11-28 DIAGNOSIS — J209 Acute bronchitis, unspecified: Secondary | ICD-10-CM | POA: Diagnosis not present

## 2024-11-28 LAB — POCT RAPID STREP A (OFFICE): Rapid Strep A Screen: POSITIVE — AB

## 2024-11-28 MED ORDER — BENZONATATE 100 MG PO CAPS: 100.0000 mg | ORAL_CAPSULE | Freq: Three times a day (TID) | ORAL | 0 refills | Status: DC

## 2024-11-28 MED ORDER — AMOXICILLIN 875 MG PO TABS: 875.0000 mg | ORAL_TABLET | Freq: Two times a day (BID) | ORAL | 0 refills | Status: AC

## 2024-11-28 MED ORDER — GUAIFENESIN ER 600 MG PO TB12: 600.0000 mg | ORAL_TABLET | Freq: Two times a day (BID) | ORAL | 0 refills | Status: AC

## 2024-11-28 MED ORDER — PREDNISONE 50 MG PO TABS: ORAL_TABLET | ORAL | 0 refills | Status: DC

## 2024-11-28 NOTE — ED Provider Notes (Signed)
 " [facility]    CSN: [id number] Arrival date & time: 11/28/24  1137      History   Chief Complaint Chief Complaint  Patient presents with   Cough   Sore Throat    HPI Casey Brewer is a 68 y.o. female.   Pt presents today due to 4 days of throat pain and persistent cough productive of yellow sputum. Pt states that she is unable to lay supine due to persistent coughing fits. Pt states that she is eating and drinking without issue. Pt states that she has been using multiple OTC meds with no significant relief.   The history is provided by the patient.  Cough Sore Throat    Past Medical History:  Diagnosis Date   Anxiety    Complication of anesthesia    air escaped from hole in my nose and was in my face   Depression    Gastritis    H. pylori infection    IBS (irritable bowel syndrome)    Osteoporosis     Patient Active Problem List   Diagnosis Date Noted   History of Helicobacter infection 01/28/2024   Lymph nodes enlarged 01/28/2024   Change in bowel habits 01/28/2024   Loose stools 01/28/2024   Hemorrhoids 01/28/2024   Tremor 10/28/2023   H. pylori infection 10/15/2023   Localized osteoporosis without current pathological fracture 09/26/2023   Bile duct stone     Past Surgical History:  Procedure Laterality Date   ABDOMINAL HYSTERECTOMY     68yo   BLADDER SURGERY     CHOLECYSTECTOMY     ESOPHAGOGASTRODUODENOSCOPY N/A 09/01/2018   Procedure: ESOPHAGOGASTRODUODENOSCOPY (EGD);  Surgeon: Wilhelmenia Aloha Raddle., MD;  Location: [facility] ENDOSCOPY;  Service: Gastroenterology;  Laterality: N/A;   EUS N/A 09/01/2018   Procedure: UPPER ENDOSCOPIC ULTRASOUND (EUS) LINEAR;  Surgeon: Wilhelmenia Aloha Raddle., MD;  Location: [facility] ENDOSCOPY;  Service: Gastroenterology;  Laterality: N/A;   THROAT SURGERY     UPPER GASTROINTESTINAL ENDOSCOPY      OB History     Gravida  7   Para  7   Term      Preterm      AB      Living  7      SAB       IAB      Ectopic      Multiple      Live Births               Home Medications    Prior to Admission medications  Medication Sig Start Date End Date Taking? Authorizing Provider  alendronate (FOSAMAX) 70 MG tablet Take 70 mg by mouth once a week. 01/01/24  Yes [provider]  amoxicillin  (AMOXIL ) 875 MG tablet Take 1 tablet (875 mg total) by mouth 2 (two) times daily for 10 days. 11/28/24 12/08/24 Yes Andra Krabbe C, PA-C  benzonatate  (TESSALON ) 100 MG capsule Take 1 capsule (100 mg total) by mouth every 8 (eight) hours. 11/28/24  Yes Kathy Wahid C, PA-C  Calcium Carbonate-Vitamin D  (CALCIUM-VITAMIN D  PO) Take 1 capsule by mouth daily.   Yes [provider]  Cyanocobalamin (VITAMIN B12 PO) Take 1 tablet by mouth daily.   Yes [provider]  guaiFENesin  (MUCINEX ) 600 MG 12 hr tablet Take 1 tablet (600 mg total) by mouth 2 (two) times daily for 10 days. 11/28/24 12/08/24 Yes Andra Krabbe C, PA-C  PARoxetine (PAXIL) 20 MG tablet Take 10 mg by mouth daily.  Yes [provider]  predniSONE  (DELTASONE ) 50 MG tablet Take 1 tab po daily for 5 days 11/28/24  Yes Andra Corean BROCKS, PA-C    Family History Family History  Problem Relation Age of Onset   Heart disease Mother    Healthy Father    Liver disease Neg Hx    Esophageal cancer Neg Hx    Colon cancer Neg Hx    Inflammatory bowel disease Neg Hx    Pancreatic cancer Neg Hx    Rectal cancer Neg Hx    Stomach cancer Neg Hx     Social History Social History[1]   Allergies   Dipyrone   Review of Systems Review of Systems  Respiratory:  Positive for cough.      Physical Exam Triage Vital Signs ED Triage Vitals  Encounter Vitals Group     BP 11/28/24 1339 102/67     Girls Systolic BP Percentile --      Girls Diastolic BP Percentile --      Boys Systolic BP Percentile --      Boys Diastolic BP Percentile --      Pulse Rate 11/28/24 1339 79     Resp  11/28/24 1339 18     Temp 11/28/24 1339 99.1 F (37.3 C)     Temp Source 11/28/24 1339 Oral     SpO2 11/28/24 1339 97 %     Weight --      Height --      Head Circumference --      Peak Flow --      Pain Score 11/28/24 1334 5     Pain Loc --      Pain Education --      Exclude from Growth Chart --    No data found.  Updated Vital Signs BP 102/67 (BP Location: Left Arm)   Pulse 79   Temp 99.1 F (37.3 C) (Oral)   Resp 18   SpO2 97%   Visual Acuity Right Eye Distance:   Left Eye Distance:   Bilateral Distance:    Right Eye Near:   Left Eye Near:    Bilateral Near:     Physical Exam Vitals and nursing note reviewed.  Constitutional:      General: She is not in acute distress.    Appearance: Normal appearance. She is not ill-appearing, toxic-appearing or diaphoretic.  HENT:     Right Ear: Tympanic membrane, ear canal and external ear normal.     Left Ear: Tympanic membrane, ear canal and external ear normal.     Mouth/Throat:     Mouth: Mucous membranes are moist.     Pharynx: Oropharynx is clear. No oropharyngeal exudate or posterior oropharyngeal erythema.  Eyes:     General: No scleral icterus. Cardiovascular:     Rate and Rhythm: Normal rate and regular rhythm.     Heart sounds: Normal heart sounds.  Pulmonary:     Effort: Pulmonary effort is normal. No respiratory distress.     Breath sounds: Normal breath sounds. No wheezing or rhonchi.  Musculoskeletal:     Cervical back: Tenderness (right sided anterior cervical tenderness) present.  Lymphadenopathy:     Cervical: No cervical adenopathy.  Skin:    General: Skin is warm.  Neurological:     Mental Status: She is alert and oriented to person, place, and time.  Psychiatric:        Mood and Affect: Mood normal.        Behavior:  Behavior normal.      UC Treatments / Results  Labs (all labs ordered are listed, but only abnormal results are displayed) Labs Reviewed  POCT RAPID STREP A (OFFICE) -  Abnormal; Notable for the following components:      Result Value   Rapid Strep A Screen Positive (*)    All other components within normal limits    EKG   Radiology No results found.  Procedures Procedures (including critical care time)  Medications Ordered in UC Medications - No data to display  Initial Impression / Assessment and Plan / UC Course  I have reviewed the triage vital signs and the nursing notes.  Pertinent labs & imaging results that were available during my care of the patient were reviewed by me and considered in my medical decision making (see chart for details).     Final Clinical Impressions(s) / UC Diagnoses   Final diagnoses:  Strep pharyngitis  Acute bronchitis, unspecified organism     Discharge Instructions      You have been diagnosed with strep throat today and prescribed an antibiotic.  You will be contagious until you have completed 24 hours of antibiotics.  You should discard your toothbrush in 24 hours as well.  You may use ibuprofen and/or Tylenol as needed for pain and fever.  Chloraseptic and Cepacol make a numbing throat lozenges that can be obtained over-the-counter that is helpful for throat pain as well.    ED Prescriptions     Medication Sig Dispense Auth. Provider   amoxicillin  (AMOXIL ) 875 MG tablet Take 1 tablet (875 mg total) by mouth 2 (two) times daily for 10 days. 20 tablet Caidence Higashi C, PA-C   predniSONE  (DELTASONE ) 50 MG tablet Take 1 tab po daily for 5 days 5 tablet Andra Krabbe C, PA-C   guaiFENesin  (MUCINEX ) 600 MG 12 hr tablet Take 1 tablet (600 mg total) by mouth 2 (two) times daily for 10 days. 20 tablet Andra Krabbe BROCKS, PA-C   benzonatate  (TESSALON ) 100 MG capsule Take 1 capsule (100 mg total) by mouth every 8 (eight) hours. 30 capsule Andra Krabbe BROCKS, PA-C      PDMP not reviewed this encounter.    [1]  Social History Tobacco Use   Smoking status: Never    Passive exposure:  Never   Smokeless tobacco: Never  Vaping Use   Vaping status: Never Used  Substance Use Topics   Alcohol use: No    Alcohol/week: 0.0 standard drinks of alcohol   Drug use: Not Currently     Andra Krabbe BROCKS DEVONNA 11/28/24 1404  "

## 2024-11-28 NOTE — ED Triage Notes (Signed)
 Spanish interpreter used for clinical intake: Fredderick #[id number]  Pt reports cough and pain in right ear for 4 days. Cough is really bad at night and coughing doesn't let me sleep. Also reports pain in throat and coughing up yellow phlegm. Taking robitussin and flu medicine. Voices concern for strep

## 2024-11-28 NOTE — Discharge Instructions (Signed)
 You have been diagnosed with strep throat today and prescribed an antibiotic.  You will be contagious until you have completed 24 hours of antibiotics.  You should discard your toothbrush in 24 hours as well.  You may use ibuprofen  and/or Tylenol  as needed for pain and fever.  Chloraseptic and Cepacol make a numbing throat lozenges that can be obtained over-the-counter that is helpful for throat pain as well.

## 2024-12-11 ENCOUNTER — Encounter: Payer: Self-pay | Admitting: Obstetrics and Gynecology

## 2024-12-11 ENCOUNTER — Ambulatory Visit: Payer: Self-pay | Admitting: Obstetrics and Gynecology

## 2024-12-11 ENCOUNTER — Other Ambulatory Visit (HOSPITAL_COMMUNITY)
Admission: RE | Admit: 2024-12-11 | Discharge: 2024-12-11 | Disposition: A | Source: Ambulatory Visit | Attending: Obstetrics and Gynecology | Admitting: Obstetrics and Gynecology

## 2024-12-11 ENCOUNTER — Ambulatory Visit (INDEPENDENT_AMBULATORY_CARE_PROVIDER_SITE_OTHER): Admitting: Obstetrics and Gynecology

## 2024-12-11 VITALS — BP 100/64 | HR 61 | Temp 98.3°F | Ht 60.5 in | Wt 163.0 lb

## 2024-12-11 DIAGNOSIS — Z124 Encounter for screening for malignant neoplasm of cervix: Secondary | ICD-10-CM | POA: Diagnosis present

## 2024-12-11 DIAGNOSIS — B009 Herpesviral infection, unspecified: Secondary | ICD-10-CM | POA: Diagnosis not present

## 2024-12-11 DIAGNOSIS — Z01419 Encounter for gynecological examination (general) (routine) without abnormal findings: Secondary | ICD-10-CM

## 2024-12-11 DIAGNOSIS — N3000 Acute cystitis without hematuria: Secondary | ICD-10-CM

## 2024-12-11 DIAGNOSIS — Z1331 Encounter for screening for depression: Secondary | ICD-10-CM | POA: Diagnosis not present

## 2024-12-11 DIAGNOSIS — N3281 Overactive bladder: Secondary | ICD-10-CM | POA: Diagnosis not present

## 2024-12-11 MED ORDER — MIRABEGRON ER 25 MG PO TB24: 25.0000 mg | ORAL_TABLET | Freq: Every day | ORAL | 5 refills | Status: AC

## 2024-12-11 MED ORDER — VALACYCLOVIR HCL 500 MG PO TABS: 500.0000 mg | ORAL_TABLET | Freq: Two times a day (BID) | ORAL | 3 refills | Status: AC

## 2024-12-11 MED ORDER — AMOXICILLIN-POT CLAVULANATE 875-125 MG PO TABS: 1.0000 | ORAL_TABLET | Freq: Two times a day (BID) | ORAL | 0 refills | Status: AC

## 2024-12-11 NOTE — Patient Instructions (Signed)
 For patients under 50-70yo, I recommend 1200mg  calcium  daily and 600IU of vitamin D daily. For patients over 70yo, I recommend 1200mg  calcium  daily and 800IU of vitamin D daily.  Health Maintenance, Female Adopting a healthy lifestyle and getting preventive care are important in promoting health and wellness. Ask your health care provider about: The right schedule for you to have regular tests and exams. Things you can do on your own to prevent diseases and keep yourself healthy. What should I know about diet, weight, and exercise? Eat a healthy diet  Eat a diet that includes plenty of vegetables, fruits, low-fat dairy products, and lean protein. Do not eat a lot of foods that are high in solid fats, added sugars, or sodium. Maintain a healthy weight Body mass index (BMI) is used to identify weight problems. It estimates body fat based on height and weight. Your health care provider can help determine your BMI and help you achieve or maintain a healthy weight. Get regular exercise Get regular exercise. This is one of the most important things you can do for your health. Most adults should: Exercise for at least 150 minutes each week. The exercise should increase your heart rate and make you sweat (moderate-intensity exercise). Do strengthening exercises at least twice a week. This is in addition to the moderate-intensity exercise. Spend less time sitting. Even light physical activity can be beneficial. Watch cholesterol and blood lipids Have your blood tested for lipids and cholesterol at 70 years of age, then have this test every 5 years. Have your cholesterol levels checked more often if: Your lipid or cholesterol levels are high. You are older than 70 years of age. You are at high risk for heart disease. What should I know about cancer screening? Depending on your health history and family history, you may need to have cancer screening at various ages. This may include screening  for: Breast cancer. Cervical cancer. Colorectal cancer. Skin cancer. Lung cancer. What should I know about heart disease, diabetes, and high blood pressure? Blood pressure and heart disease High blood pressure causes heart disease and increases the risk of stroke. This is more likely to develop in people who have high blood pressure readings or are overweight. Have your blood pressure checked: Every 3-5 years if you are 25-57 years of age. Every year if you are 24 years old or older. Diabetes Have regular diabetes screenings. This checks your fasting blood sugar level. Have the screening done: Once every three years after age 62 if you are at a normal weight and have a low risk for diabetes. More often and at a younger age if you are overweight or have a high risk for diabetes. What should I know about preventing infection? Hepatitis B If you have a higher risk for hepatitis B, you should be screened for this virus. Talk with your health care provider to find out if you are at risk for hepatitis B infection. Hepatitis C Testing is recommended for: Everyone born from [birth year] through 1966. Anyone with known risk factors for hepatitis C. Sexually transmitted infections (STIs) Get screened for STIs, including gonorrhea and chlamydia, if: You are sexually active and are younger than 70 years of age. You are older than 70 years of age and your health care provider tells you that you are at risk for this type of infection. Your sexual activity has changed since you were last screened, and you are at increased risk for chlamydia or gonorrhea. Ask your health care provider if  you are at risk. Ask your health care provider about whether you are at high risk for HIV. Your health care provider may recommend a prescription medicine to help prevent HIV infection. If you choose to take medicine to prevent HIV, you should first get tested for HIV. You should then be tested every 3 months for as long as you  are taking the medicine. Osteoporosis and menopause Osteoporosis is a disease in which the bones lose minerals and strength with aging. This can result in bone fractures. If you are 72 years old or older, or if you are at risk for osteoporosis and fractures, ask your health care provider if you should: Be screened for bone loss. Take a calcium  or vitamin D supplement to lower your risk of fractures. Be given hormone replacement therapy (HRT) to treat symptoms of menopause. Follow these instructions at home: Alcohol use Do not drink alcohol if: Your health care provider tells you not to drink. You are pregnant, may be pregnant, or are planning to become pregnant. If you drink alcohol: Limit how much you have to: 0-1 drink a day. Know how much alcohol is in your drink. In the [place] one drink equals one 12 oz bottle of beer (355 mL), one 5 oz glass of wine (148 mL), or one 1 oz glass of hard liquor (44 mL). Lifestyle Do not use any products that contain nicotine or tobacco. These products include cigarettes, chewing tobacco, and vaping devices, such as e-cigarettes. If you need help quitting, ask your health care provider. Do not use street drugs. Do not share needles. Ask your health care provider for help if you need support or information about quitting drugs. General instructions Schedule regular health, dental, and eye exams. Stay current with your vaccines. Tell your health care provider if: You often feel depressed. You have ever been abused or do not feel safe at home. Summary Adopting a healthy lifestyle and getting preventive care are important in promoting health and wellness. Follow your health care provider's instructions about healthy diet, exercising, and getting tested or screened for diseases. Follow your health care provider's instructions on monitoring your cholesterol and blood pressure. This information is not intended to replace advice given to you by your health  care provider. Make sure you discuss any questions you have with your health care provider. Document Revised: 04/10/2021 Document Reviewed: 04/10/2021 Elsevier Patient Education  2024 [employer].

## 2024-12-11 NOTE — Assessment & Plan Note (Addendum)
 Patient with OAB symptoms. Education on cause provided. Discussed lifestyle modifications for management, including avoidance of fluid prior to bed and bladder irritants. Also discussed kegels and medical managements.Patient desires medical therapy. Will start myrbetriq .  Side effects include HTN and palpitations. W/u in 3-4 months for BP reassessment. Can increased to 50mg  if needed at that time

## 2024-12-11 NOTE — Progress Notes (Signed)
 "  69 y.o. [id number] postmenopausal female with osteoporosis here for annual exam. Widowed. She has 7 children, 18 grandchildren, and 3 great grandchildren. Due to language barrier, an interpreter was present during the history-taking and subsequent discussion (and for part of the physical exam) with this patient.  PCP: Nikki Rams, Aliene, MD   She reports gets up at night to urinate twice a night and drinks a lot of water.  She would like a refill for valtex, takes PRN. Taking Fosamax , prescribed by PCP. Planning to travel to [place] for 4 months this month.  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 She required no surveillance following her hysterectomy.  Urine sample provided: no  Postmenopausal bleeding: none Pelvic discharge or pain: none Breast mass, nipple discharge or skin changes : none Sexually active: not currently   Last PAP: No results found for: DIAGPAP, HPVHIGH, ADEQPAP 2024 HPV neg, cytology unsatisfactory 2/2 low cellularity Last mammogram: 11/13/24 BIRADS 1  Last DXA:  10/232024, spine T-2.6, L femur -1.3, L hip -0.2, L radius -3.4   Last colonoscopy: 05/05/24 q10   Exercising: yes, walking and strength Smoker:no  Flowsheet Row Office Visit from 12/11/2024 in [facility]  PHQ-2 Total Score 0      GYN HISTORY: Hysterectomy for stage 1 cancer   OB History  Gravida Para Term Preterm AB Living  7 7 7   7   SAB IAB Ectopic Multiple Live Births      1    # Outcome Date GA Lbr Len/2nd Weight Sex Type Anes PTL Lv  7 Term      Vag-Spont   LIV  6 Term           5 Term           4 Term           3 Term           2 Term           1 Term            Past Medical History:  Diagnosis Date   Anxiety    Complication of anesthesia    air escaped from hole in my nose and was in my face   Depression    Gastritis    H. pylori infection    IBS (irritable bowel syndrome)    Osteoporosis    Past  Surgical History:  Procedure Laterality Date   ABDOMINAL HYSTERECTOMY     69yo   BLADDER SURGERY     CHOLECYSTECTOMY     ESOPHAGOGASTRODUODENOSCOPY N/A 09/01/2018   Procedure: ESOPHAGOGASTRODUODENOSCOPY (EGD);  Surgeon: Wilhelmenia Aloha Raddle., MD;  Location: [facility] ENDOSCOPY;  Service: Gastroenterology;  Laterality: N/A;   EUS N/A 09/01/2018   Procedure: UPPER ENDOSCOPIC ULTRASOUND (EUS) LINEAR;  Surgeon: Wilhelmenia Aloha Raddle., MD;  Location: [facility] ENDOSCOPY;  Service: Gastroenterology;  Laterality: N/A;   THROAT SURGERY     UPPER GASTROINTESTINAL ENDOSCOPY     Medications Ordered Prior to Encounter[1] Social History   Socioeconomic History   Marital status: Widowed    Spouse name: Not on file   Number of children: 7   Years of education: Not on file   Highest education level: Not on file  Occupational History   Occupation: retired  Tobacco Use   Smoking status: Never    Passive exposure: Never   Smokeless tobacco: Never  Vaping Use   Vaping status: Never Used  Substance and  Sexual Activity   Alcohol use: No    Alcohol/week: 0.0 standard drinks of alcohol   Drug use: Not Currently   Sexual activity: Not Currently    Partners: Male    Birth control/protection: Post-menopausal, Surgical    Comment: Hysterectomy  Other Topics Concern   Not on file  Social History Narrative   ** Merged History Encounter **       Social Drivers of Health   Tobacco Use: Low Risk (12/11/2024)   Patient History    Smoking Tobacco Use: Never    Smokeless Tobacco Use: Never    Passive Exposure: Never  Financial Resource Strain: Not on file  Food Insecurity: Not on file  Transportation Needs: Not on file  Physical Activity: Not on file  Stress: Not on file  Social Connections: Not on file  Intimate Partner Violence: Not on file  Depression (PHQ2-9): Low Risk (12/11/2024)   Depression (PHQ2-9)    PHQ-2 Score: 0  Alcohol Screen: Not on file  Housing: Not on file  Utilities: Not on file   Health Literacy: Not on file   Family History  Problem Relation Age of Onset   Heart disease Mother    Healthy Father    Liver disease Neg Hx    Esophageal cancer Neg Hx    Colon cancer Neg Hx    Inflammatory bowel disease Neg Hx    Pancreatic cancer Neg Hx    Rectal cancer Neg Hx    Stomach cancer Neg Hx    Allergies[2]    PE Today's Vitals   12/11/24 0912  BP: 100/64  Pulse: 61  Temp: 98.3 F (36.8 C)  TempSrc: Oral  SpO2: 97%  Weight: 163 lb (73.9 kg)  Height: 5' 0.5 (1.537 m)   Body mass index is 31.31 kg/m.  Physical Exam Vitals reviewed. Exam conducted with a chaperone present.  Constitutional:      General: She is not in acute distress.    Appearance: Normal appearance.  HENT:     Head: Normocephalic and atraumatic.     Nose: Nose normal.  Eyes:     Extraocular Movements: Extraocular movements intact.     Conjunctiva/sclera: Conjunctivae normal.  Pulmonary:     Effort: Pulmonary effort is normal.  Chest:     Chest wall: No mass or tenderness.  Breasts:    Right: Inverted nipple present. No swelling, mass, nipple discharge or tenderness.     Left: Inverted nipple present. No swelling, mass, nipple discharge or tenderness. Bleeding: (chronic per patient). Abdominal:     General: There is no distension.     Palpations: Abdomen is soft.     Tenderness: There is no abdominal tenderness.  Genitourinary:    General: Normal vulva.     Exam position: Lithotomy position.     Urethra: No prolapse.     Vagina: Normal. No vaginal discharge or bleeding.     Cervix: No lesion.     Adnexa: Right adnexa normal and left adnexa normal.     Comments: Cervix and uterus absent Kegel 3/5 Musculoskeletal:        General: Normal range of motion.     Cervical back: Normal range of motion.  Lymphadenopathy:     Upper Body:     Right upper body: No axillary adenopathy.     Left upper body: No axillary adenopathy.     Lower Body: No right inguinal adenopathy. No  left inguinal adenopathy.  Skin:    General: Skin is  warm and dry.  Neurological:     General: No focal deficit present.     Mental Status: She is alert.  Psychiatric:        Mood and Affect: Mood normal.        Behavior: Behavior normal.       Assessment and Plan:        Well woman exam with routine gynecological exam Assessment & Plan: Cervical cancer screening performed for baseline cytology, patient reports hysterectomy in her 1s for early stage reproductive cancer. No required no surveillance following her hysterectomy. Encouraged annual mammogram screening Colonoscopy UTD DXA 09/25/23, managed by PCP Labs and immunizations with her primary Encouraged safe sexual practices as indicated Encouraged healthy lifestyle practices with diet and exercise For patients under 50-70yo, I recommend 1200mg  calcium daily and 600IU of vitamin D  daily.    Cervical cancer screening -     Cytology - PAP  Negative depression screening  Herpes -     valACYclovir  HCl; Take 1 tablet (500 mg total) by mouth 2 (two) times daily for 3 days. As needed with outbreaks  Dispense: 30 tablet; Refill: 3  OAB (overactive bladder) Assessment & Plan: Patient with OAB symptoms. Education on cause provided. Discussed lifestyle modifications for management, including avoidance of fluid prior to bed and bladder irritants. Also discussed kegels and medical managements.Patient desires medical therapy. Will start myrbetriq .  Side effects include HTN and palpitations. W/u in 3-4 months for BP reassessment. Can increased to 50mg  if needed at that time   Orders: -     Mirabegron  ER; Take 1 tablet (25 mg total) by mouth daily.  Dispense: 30 tablet; Refill: 5 -     Urinalysis,Complete w/RFL Culture   Vera LULLA Pa, MD      [1]  Current Outpatient Medications on File Prior to Visit  Medication Sig Dispense Refill   alendronate  (FOSAMAX ) 70 MG tablet Take 70 mg by mouth once a week.     Calcium  Carbonate-Vitamin D  (CALCIUM-VITAMIN D  PO) Take 1 capsule by mouth daily.     Cholecalciferol (VITAMIN D -3 PO) Take by mouth.     Cyanocobalamin (VITAMIN B12 PO) Take 1 tablet by mouth daily. (Patient not taking: Reported on 12/11/2024)     Current Facility-Administered Medications on File Prior to Visit  Medication Dose Route Frequency Provider Last Rate Last Admin   0.9 %  sodium chloride  infusion  500 mL Intravenous Once Mansouraty, Gabriel Jr., MD      [2]  Allergies Allergen Reactions   Dipyrone Palpitations   "

## 2024-12-11 NOTE — Assessment & Plan Note (Signed)
 Cervical cancer screening performed for baseline cytology, patient reports hysterectomy in her 61s for early stage reproductive cancer. No required no surveillance following her hysterectomy. Encouraged annual mammogram screening Colonoscopy UTD DXA 09/25/23, managed by PCP Labs and immunizations with her primary Encouraged safe sexual practices as indicated Encouraged healthy lifestyle practices with diet and exercise For patients under 50-70yo, I recommend 1200mg  calcium daily and 600IU of vitamin D  daily.

## 2024-12-14 LAB — URINE CULTURE
MICRO NUMBER:: 17448904
SPECIMEN QUALITY:: ADEQUATE

## 2024-12-14 LAB — URINALYSIS, COMPLETE W/RFL CULTURE
Bilirubin Urine: NEGATIVE
Glucose, UA: NEGATIVE
Hyaline Cast: NONE SEEN /LPF
Ketones, ur: NEGATIVE
Nitrites, Initial: POSITIVE — AB
Protein, ur: NEGATIVE
Specific Gravity, Urine: 1.018 (ref 1.001–1.035)
pH: 8.5 — ABNORMAL HIGH (ref 5.0–8.0)

## 2024-12-14 LAB — CULTURE INDICATED

## 2024-12-15 LAB — CYTOLOGY - PAP: Diagnosis: NEGATIVE

## 2024-12-16 ENCOUNTER — Encounter: Payer: Self-pay | Admitting: "Endocrinology

## 2024-12-16 ENCOUNTER — Other Ambulatory Visit

## 2024-12-16 ENCOUNTER — Ambulatory Visit (INDEPENDENT_AMBULATORY_CARE_PROVIDER_SITE_OTHER): Admitting: "Endocrinology

## 2024-12-16 VITALS — BP 120/70 | HR 86 | Ht 60.0 in | Wt 163.0 lb

## 2024-12-16 DIAGNOSIS — M810 Age-related osteoporosis without current pathological fracture: Secondary | ICD-10-CM | POA: Diagnosis not present

## 2024-12-16 DIAGNOSIS — N3000 Acute cystitis without hematuria: Secondary | ICD-10-CM

## 2024-12-16 MED ORDER — ALENDRONATE SODIUM 70 MG PO TABS: 70.0000 mg | ORAL_TABLET | ORAL | 1 refills | Status: AC

## 2024-12-16 NOTE — Progress Notes (Signed)
 "   [facility] Note Obadiah Birmingham, MD    Referring Provider: Nikki Rams, Aliene, MD Primary Care Provider: Nikki Rams, Aliene, MD No chief complaint on file.    Assessment & Plan  Diagnoses and all orders for this visit:  Age-related osteoporosis without current pathological fracture -     Renal function panel -     Renal function panel -     VITAMIN D  25 Hydroxy (Vit-D Deficiency, Fractures)  Other orders -     alendronate  (FOSAMAX ) 70 MG tablet; Take 1 tablet (70 mg total) by mouth once a week.      Osteoporosis Likely secondary cause from age (and possibly from earlier salpino-oophorectomy? History unknown, doctor who did surgery in [place] passed away.  Per records, last DXA: 09/25/2023, spine T-2.6, L femur -1.3, L hip -0.2, L radius -3.4. She is currently taking MVI (2 gummies) + Calcium 1200 and vitamin D  400 IU once daily. Start weight bearing exercise options. Recommend Calcium 600mg  twice a day and continue current Vit D.  Started fosamax  around 2025. No current contraindications. Educated on risks and side effects of fosamax  previously including but not limited to esophagitis,worsening GERD, atypical femoral fractures and osteonecrosis of the jaw. Advised to take medication first thing in the morning with plenty of water and stay upright for 30 minutes after taking the medication.  Follow fall precautions, adequate dairy in diet and exercises (aerobic, balancing and weight bearing) as tolerated.   Return in about 10 months (around 10/16/2025) for visit + labs before next visit, labs today.  I have reviewed current medications, nurse's notes, allergies, vital signs, past medical and surgical history, family medical history, and social history for this encounter. Counseled patient on symptoms, examination findings, lab findings, imaging results, treatment decisions and monitoring and prognosis. The patient understood the recommendations and agrees with the  treatment plan. All questions regarding treatment plan were fully answered.   Obadiah Birmingham, MD   12/16/2024    History of Present Illness Casey Brewer is a 69 y.o. year old female who presents to our clinic with osteoporosis diagnosed in 2024.  Per records, last DXA: 10/232024, spine T-2.6, L femur -1.3, L hip -0.2, L radius -3.4 (no report available), suggested osteoporosis. She is currently taking MVI (2 gummies) + Calcium 1200 with Vit D 1000 IU once daily and vitamin D  400 IU once daily.  No falls/fractures Takes Vit D 4000 units once a day Takes calcium 1200 mg once a time Also takes a MVI once a day Takes fosamax  70 mg every day-denies any S/E, dental/gum issues; has seen dentist   Initial history:  Risk Factors screening:  History of low trauma fractures: No Family history of osteoporosis: No Hip fracture in first-degree relatives: No Smoking history: No Excessive alcohol intake >2 drinks/day: No Excessive caffeine intake >2 drinks/day: No Glucocorticoid use >5mg  prednisone /day for >3 months: No Rheumatoid arthritis history: No Premature/Surgical Menopause: Yes, at 41, unclear about ovaries, done in [place] for early stage cancer   Anti-epileptic drugs No  Celiac disease/signs of malabsorption No  Gastric bypass/gastrectomy No  PPI use No  TZD use No  Gonadotropin/androgen suppressing drugs No  Aromatase Inhibitors No  Thyroid  hormone suppressive therapy No  [facility]: EXAM: DUAL X-RAY ABSORPTIOMETRY (DXA) FOR BONE MINERAL DENSITY 09/25/2023 1:07 pm  CLINICAL DATA: 69 year old Female Postmenopausal. Encounter for screening for osteoporosis  TECHNIQUE: An axial (e.g., hips, spine) and/or appendicular (e.g., radius) exam was performed, as  appropriate, using [employer]. Images are obtained for bone mineral density measurement and are not obtained for diagnostic purposes. [id number]  Trabecular Bone Score  (TBS) was derived from the texture of the DXA image of the spine. TBS has been shown to be related to bone microarchitecture and fracture risk. It provides information independent of bone mineral density and can be used to modify FRAX score.  Exclusions: None.  COMPARISON: None.  FINDINGS: Scan quality: Good.  LUMBAR SPINE (L1-L4):  BMD (in g/cm2): 0.756  T-score: -2.6  Z-score: -0.7  LEFT FEMORAL NECK:  BMD (in g/cm2): 0.710  T-score: -1.3  Z-score: 0.2  LEFT TOTAL HIP:  BMD (in g/cm2): 0.920  T-score: -0.2  Z-score: 1.0  LEFT FOREARM (RADIUS 33%):  BMD (in g/cm2): 0.489  T-score: -3.4  Z-score: -1.5  FRAX 10-YEAR PROBABILITY OF FRACTURE:  FRAX not reported as the lowest BMD is not in the osteopenia range.  TRABECULAR BONE SCORE (TBS): L1-L4= 1.182  The lumbar spine TBS: Degraded microarchitecture.  IMPRESSION: Osteoporosis based on BMD.  Fracture risk is increased. Increased risk is based on low BMD.  RECOMMENDATIONS: 1. All patients should optimize calcium and vitamin D  intake.  2. Consider FDA-approved medical therapies in postmenopausal women and men aged 31 years and older, based on the following:  - A hip or vertebral (clinical or morphometric) fracture  - T-score less than or equal to -2.5 and secondary causes have been excluded.  - Low bone mass (T-score between -1.0 and -2.5) and a 10-year probability of a hip fracture greater than or equal to 3% or a 10-year probability of a major osteoporosis-related fracture greater than or equal to 20% based on the [place] -adapted WHO algorithm.  - Clinician judgment and/or patient preferences may indicate treatment for people with 10-year fracture probabilities above or below these levels  3. Patients with diagnosis of osteoporosis or at high risk for fracture should have regular bone mineral density tests. For patients eligible for Medicare, routine testing is allowed once every 2 years. The  testing frequency can be increased to one year for patients who have rapidly progressing disease, those who are receiving or discontinuing medical therapy to restore bone mass, or have additional risk factors.    Physical Exam  BP 120/70   Pulse 86   Ht 5' (1.524 m)   Wt 163 lb (73.9 kg)   SpO2 96%   BMI 31.83 kg/m  Constitutional: well developed, well nourished Head: normocephalic, atraumatic Eyes: sclera anicteric, no redness Neck: supple Lungs: normal respiratory effort Neurology: alert and oriented Skin: dry, no appreciable rashes Musculoskeletal: no appreciable defects Psychiatric: normal mood and affect  Allergies Allergies  Allergen Reactions   Dipyrone Palpitations    Current Medications Patient's Medications  New Prescriptions   No medications on file  Previous Medications   AMOXICILLIN -CLAVULANATE (AUGMENTIN ) 875-125 MG TABLET    Take 1 tablet by mouth 2 (two) times daily for 5 days.   CALCIUM CARBONATE-VITAMIN D  (CALCIUM-VITAMIN D  PO)    Take 1 capsule by mouth daily.   CHOLECALCIFEROL (VITAMIN D -3 PO)    Take by mouth.   CYANOCOBALAMIN (VITAMIN B12 PO)    Take 1 tablet by mouth daily.   MIRABEGRON  ER (MYRBETRIQ ) 25 MG TB24 TABLET    Take 1 tablet (25 mg total) by mouth daily.  Modified Medications   Modified Medication Previous Medication   ALENDRONATE  (FOSAMAX ) 70 MG TABLET alendronate  (FOSAMAX ) 70 MG tablet  Take 1 tablet (70 mg total) by mouth once a week.    Take 70 mg by mouth once a week.  Discontinued Medications   No medications on file     Past Medical History Past Medical History:  Diagnosis Date   Anxiety    Complication of anesthesia    air escaped from hole in my nose and was in my face   Depression    Gastritis    H. pylori infection    IBS (irritable bowel syndrome)    Osteoporosis     Past Surgical History Past Surgical History:  Procedure Laterality Date   ABDOMINAL HYSTERECTOMY     69yo   BLADDER SURGERY      CHOLECYSTECTOMY     ESOPHAGOGASTRODUODENOSCOPY N/A 09/01/2018   Procedure: ESOPHAGOGASTRODUODENOSCOPY (EGD);  Surgeon: Wilhelmenia Aloha Raddle., MD;  Location: [facility] ENDOSCOPY;  Service: Gastroenterology;  Laterality: N/A;   EUS N/A 09/01/2018   Procedure: UPPER ENDOSCOPIC ULTRASOUND (EUS) LINEAR;  Surgeon: Wilhelmenia Aloha Raddle., MD;  Location: [facility] ENDOSCOPY;  Service: Gastroenterology;  Laterality: N/A;   THROAT SURGERY     UPPER GASTROINTESTINAL ENDOSCOPY      Family History family history includes Healthy in her father; Heart disease in her mother.  Social History Social History   Socioeconomic History   Marital status: Widowed    Spouse name: Not on file   Number of children: 7   Years of education: Not on file   Highest education level: Not on file  Occupational History   Occupation: retired  Tobacco Use   Smoking status: Never    Passive exposure: Never   Smokeless tobacco: Never  Vaping Use   Vaping status: Never Used  Substance and Sexual Activity   Alcohol use: No    Alcohol/week: 0.0 standard drinks of alcohol   Drug use: Not Currently   Sexual activity: Not Currently    Partners: Male    Birth control/protection: Post-menopausal, Surgical    Comment: Hysterectomy  Other Topics Concern   Not on file  Social History Narrative   ** Merged History Encounter **       Social Drivers of Health   Tobacco Use: Low Risk (12/16/2024)   Patient History    Smoking Tobacco Use: Never    Smokeless Tobacco Use: Never    Passive Exposure: Never  Financial Resource Strain: Not on file  Food Insecurity: Not on file  Transportation Needs: Not on file  Physical Activity: Not on file  Stress: Not on file  Social Connections: Not on file  Intimate Partner Violence: Not on file  Depression (PHQ2-9): Low Risk (12/11/2024)   Depression (PHQ2-9)    PHQ-2 Score: 0  Alcohol Screen: Not on file  Housing: Not on file  Utilities: Not on file  Health Literacy: Not on file     Laboratory Investigations No components found for: CMP No components found for: BMP Lab Results  Component Value Date   GFR 83.03 04/29/2024   Lab Results  Component Value Date   CREATININE 0.72 06/15/2024   No results found for: CBC No components found for: LFT No components found for: VITD Lab Results  Component Value Date   PTH 39 09/26/2023    Lab Results  Component Value Date   TSH 2.05 01/28/2024    No components found for: RENAL FUNCTION No components found for: MAGNESIUM  Parts of this note may have been dictated using voice recognition software. There may be variances in spelling and vocabulary which are  unintentional. Not all errors are proofread. Please notify the dino if any discrepancies are noted or if the meaning of any statement is not clear.  "

## 2024-12-17 LAB — RENAL FUNCTION PANEL
Albumin: 4.1 g/dL (ref 3.6–5.1)
BUN: 14 mg/dL (ref 7–25)
CO2: 26 mmol/L (ref 20–32)
Calcium: 9.3 mg/dL (ref 8.6–10.4)
Chloride: 104 mmol/L (ref 98–110)
Creat: 0.65 mg/dL (ref 0.50–1.05)
Glucose, Bld: 96 mg/dL (ref 65–99)
Phosphorus: 3.4 mg/dL (ref 2.1–4.3)
Potassium: 4.3 mmol/L (ref 3.5–5.3)
Sodium: 137 mmol/L (ref 135–146)

## 2024-12-17 LAB — URINE CULTURE
MICRO NUMBER:: 17468001
MICRO NUMBER:: 17468276
Result:: NO GROWTH
Result:: NO GROWTH
SPECIMEN QUALITY:: ADEQUATE
SPECIMEN QUALITY:: ADEQUATE

## 2024-12-22 ENCOUNTER — Ambulatory Visit: Payer: Self-pay | Admitting: Obstetrics and Gynecology

## 2025-10-07 ENCOUNTER — Other Ambulatory Visit

## 2025-10-11 ENCOUNTER — Ambulatory Visit: Admitting: "Endocrinology

## 2025-12-13 ENCOUNTER — Ambulatory Visit: Admitting: Obstetrics and Gynecology
